# Patient Record
Sex: Female | Born: 1935
Health system: Southern US, Community
[De-identification: ages and names within clinical notes are randomized; demographics above are authoritative.]

## PROBLEM LIST (undated history)

## (undated) DIAGNOSIS — J449 Chronic obstructive pulmonary disease, unspecified: Secondary | ICD-10-CM

## (undated) DIAGNOSIS — I10 Essential (primary) hypertension: Secondary | ICD-10-CM

## (undated) DIAGNOSIS — I714 Abdominal aortic aneurysm, without rupture: Secondary | ICD-10-CM

## (undated) DIAGNOSIS — E785 Hyperlipidemia, unspecified: Secondary | ICD-10-CM

## (undated) HISTORY — DX: Hyperlipidemia, unspecified: E78.5

## (undated) HISTORY — DX: Abdominal aortic aneurysm, without rupture: I71.4

## (undated) HISTORY — PX: OTHER SURGICAL HISTORY: SHX169

## (undated) HISTORY — DX: Essential (primary) hypertension: I10

## (undated) HISTORY — DX: Chronic obstructive pulmonary disease, unspecified: J44.9

---

## 1966-11-18 HISTORY — PX: APPENDECTOMY: SHX54

## 2007-01-20 ENCOUNTER — Other Ambulatory Visit: Admission: RE | Admit: 2007-01-20 | Discharge: 2007-01-20 | Payer: Self-pay | Admitting: Family Medicine

## 2007-02-17 ENCOUNTER — Encounter: Admission: RE | Admit: 2007-02-17 | Discharge: 2007-02-17 | Payer: Self-pay | Admitting: Family Medicine

## 2008-02-02 ENCOUNTER — Encounter: Admission: RE | Admit: 2008-02-02 | Discharge: 2008-02-02 | Payer: Self-pay | Admitting: Family Medicine

## 2008-02-03 ENCOUNTER — Ambulatory Visit: Payer: Self-pay | Admitting: Vascular Surgery

## 2008-02-16 ENCOUNTER — Encounter: Admission: RE | Admit: 2008-02-16 | Discharge: 2008-02-16 | Payer: Self-pay | Admitting: Family Medicine

## 2008-03-01 ENCOUNTER — Encounter: Admission: RE | Admit: 2008-03-01 | Discharge: 2008-03-01 | Payer: Self-pay | Admitting: Family Medicine

## 2008-08-22 ENCOUNTER — Encounter: Admission: RE | Admit: 2008-08-22 | Discharge: 2008-08-22 | Payer: Self-pay | Admitting: Family Medicine

## 2008-09-06 ENCOUNTER — Encounter: Admission: RE | Admit: 2008-09-06 | Discharge: 2008-09-06 | Payer: Self-pay | Admitting: Family Medicine

## 2009-03-21 ENCOUNTER — Ambulatory Visit (HOSPITAL_COMMUNITY): Admission: RE | Admit: 2009-03-21 | Discharge: 2009-03-21 | Payer: Self-pay | Admitting: Family Medicine

## 2010-01-16 HISTORY — PX: COLONOSCOPY: SHX174

## 2010-04-11 ENCOUNTER — Ambulatory Visit (HOSPITAL_COMMUNITY): Admission: RE | Admit: 2010-04-11 | Discharge: 2010-04-11 | Payer: Self-pay | Admitting: Family Medicine

## 2010-12-09 ENCOUNTER — Encounter: Payer: Self-pay | Admitting: Family Medicine

## 2010-12-27 ENCOUNTER — Inpatient Hospital Stay (HOSPITAL_COMMUNITY): Payer: Medicare Other

## 2010-12-27 ENCOUNTER — Inpatient Hospital Stay (HOSPITAL_COMMUNITY)
Admission: AD | Admit: 2010-12-27 | Discharge: 2011-01-01 | DRG: 193 | Disposition: A | Discharge status: Home Health | Payer: Medicare Other | Source: Ambulatory Visit | Attending: Internal Medicine | Admitting: Internal Medicine

## 2010-12-27 DIAGNOSIS — R9431 Abnormal electrocardiogram [ECG] [EKG]: Secondary | ICD-10-CM | POA: Diagnosis present

## 2010-12-27 DIAGNOSIS — F17200 Nicotine dependence, unspecified, uncomplicated: Secondary | ICD-10-CM | POA: Diagnosis present

## 2010-12-27 DIAGNOSIS — R0902 Hypoxemia: Secondary | ICD-10-CM | POA: Diagnosis present

## 2010-12-27 DIAGNOSIS — R071 Chest pain on breathing: Secondary | ICD-10-CM | POA: Diagnosis present

## 2010-12-27 DIAGNOSIS — E785 Hyperlipidemia, unspecified: Secondary | ICD-10-CM | POA: Diagnosis present

## 2010-12-27 DIAGNOSIS — D649 Anemia, unspecified: Secondary | ICD-10-CM | POA: Diagnosis present

## 2010-12-27 DIAGNOSIS — G9340 Encephalopathy, unspecified: Secondary | ICD-10-CM | POA: Diagnosis not present

## 2010-12-27 DIAGNOSIS — J9600 Acute respiratory failure, unspecified whether with hypoxia or hypercapnia: Secondary | ICD-10-CM | POA: Diagnosis present

## 2010-12-27 DIAGNOSIS — J441 Chronic obstructive pulmonary disease with (acute) exacerbation: Secondary | ICD-10-CM | POA: Diagnosis present

## 2010-12-27 DIAGNOSIS — R946 Abnormal results of thyroid function studies: Secondary | ICD-10-CM | POA: Diagnosis present

## 2010-12-27 DIAGNOSIS — M1990 Unspecified osteoarthritis, unspecified site: Secondary | ICD-10-CM | POA: Diagnosis present

## 2010-12-27 DIAGNOSIS — D473 Essential (hemorrhagic) thrombocythemia: Secondary | ICD-10-CM | POA: Diagnosis present

## 2010-12-27 DIAGNOSIS — R7989 Other specified abnormal findings of blood chemistry: Secondary | ICD-10-CM | POA: Diagnosis present

## 2010-12-27 DIAGNOSIS — K625 Hemorrhage of anus and rectum: Secondary | ICD-10-CM | POA: Diagnosis not present

## 2010-12-27 DIAGNOSIS — J189 Pneumonia, unspecified organism: Principal | ICD-10-CM | POA: Diagnosis present

## 2010-12-27 DIAGNOSIS — I10 Essential (primary) hypertension: Secondary | ICD-10-CM | POA: Diagnosis present

## 2010-12-27 LAB — BASIC METABOLIC PANEL
CO2: 26 mEq/L (ref 19–32)
Chloride: 99 mEq/L (ref 96–112)
Creatinine, Ser: 1.03 mg/dL (ref 0.4–1.2)
Glucose, Bld: 108 mg/dL — ABNORMAL HIGH (ref 70–99)
Potassium: 4.3 mEq/L (ref 3.5–5.1)

## 2010-12-27 LAB — CBC
MCH: 30.4 pg (ref 26.0–34.0)
MCV: 86.7 fL (ref 78.0–100.0)
Platelets: 488 10*3/uL — ABNORMAL HIGH (ref 150–400)
RDW: 13.5 % (ref 11.5–15.5)

## 2010-12-28 ENCOUNTER — Inpatient Hospital Stay (HOSPITAL_COMMUNITY): Payer: Medicare Other

## 2010-12-28 LAB — HEMOGLOBIN A1C
Hgb A1c MFr Bld: 5.8 % — ABNORMAL HIGH (ref <5.7)
Mean Plasma Glucose: 120 mg/dL — ABNORMAL HIGH (ref <117)

## 2010-12-28 LAB — CBC
HCT: 31.7 % — ABNORMAL LOW (ref 36.0–46.0)
MCV: 86.1 fL (ref 78.0–100.0)
RBC: 3.68 MIL/uL — ABNORMAL LOW (ref 3.87–5.11)
RDW: 13.6 % (ref 11.5–15.5)

## 2010-12-28 LAB — TSH: TSH: 0.222 u[IU]/mL — ABNORMAL LOW (ref 0.350–4.500)

## 2010-12-28 LAB — COMPREHENSIVE METABOLIC PANEL
ALT: 38 U/L — ABNORMAL HIGH (ref 0–35)
AST: 42 U/L — ABNORMAL HIGH (ref 0–37)
Albumin: 2.2 g/dL — ABNORMAL LOW (ref 3.5–5.2)
BUN: 15 mg/dL (ref 6–23)
CO2: 22 mEq/L (ref 19–32)
Creatinine, Ser: 0.92 mg/dL (ref 0.4–1.2)
GFR calc Af Amer: >60 mL/min (ref >60)

## 2010-12-28 LAB — CARDIAC PANEL(CRET KIN+CKTOT+MB+TROPI): CK, MB: 5.3 ng/mL — ABNORMAL HIGH (ref 0.3–4.0)

## 2010-12-28 MED ORDER — IOHEXOL 300 MG/ML  SOLN: 100.0000 mL | Freq: Once | INTRAMUSCULAR | Status: AC | PRN

## 2010-12-28 MED ORDER — IOHEXOL 300 MG/ML  SOLN
100.0000 mL | Freq: Once | INTRAMUSCULAR | Status: AC | PRN
  Administered 2010-12-28: 100 mL via INTRAVENOUS

## 2010-12-29 LAB — BASIC METABOLIC PANEL
BUN: 11 mg/dL (ref 6–23)
Calcium: 7.6 mg/dL — ABNORMAL LOW (ref 8.4–10.5)
Chloride: 108 mEq/L (ref 96–112)
GFR calc non Af Amer: >60 mL/min (ref >60)
Glucose, Bld: 110 mg/dL — ABNORMAL HIGH (ref 70–99)
Potassium: 3.9 mEq/L (ref 3.5–5.1)

## 2010-12-29 LAB — CBC
MCH: 30 pg (ref 26.0–34.0)
RBC: 3.17 MIL/uL — ABNORMAL LOW (ref 3.87–5.11)

## 2010-12-30 LAB — DIFFERENTIAL
Basophils Relative: 0 % (ref 0–1)
Eosinophils Absolute: 0 10*3/uL (ref 0.0–0.7)
Eosinophils Relative: 0 % (ref 0–5)
Monocytes Absolute: 1.3 10*3/uL — ABNORMAL HIGH (ref 0.1–1.0)
Monocytes Relative: 6 % (ref 3–12)
Neutrophils Relative %: 85 % — ABNORMAL HIGH (ref 43–77)

## 2010-12-30 LAB — CULTURE, RESPIRATORY W GRAM STAIN: Culture: NORMAL

## 2010-12-30 LAB — COMPREHENSIVE METABOLIC PANEL
AST: 26 U/L (ref 0–37)
Albumin: 1.8 g/dL — ABNORMAL LOW (ref 3.5–5.2)
BUN: 14 mg/dL (ref 6–23)
Calcium: 8.1 mg/dL — ABNORMAL LOW (ref 8.4–10.5)
Creatinine, Ser: 0.73 mg/dL (ref 0.4–1.2)
GFR calc Af Amer: >60 mL/min (ref >60)

## 2010-12-30 LAB — T4, FREE: Free T4: 1.11 ng/dL (ref 0.80–1.80)

## 2010-12-30 LAB — CBC
MCH: 29.5 pg (ref 26.0–34.0)
MCHC: 34.1 g/dL (ref 30.0–36.0)
MCV: 86.3 fL (ref 78.0–100.0)
Platelets: 635 10*3/uL — ABNORMAL HIGH (ref 150–400)
RDW: 13.9 % (ref 11.5–15.5)

## 2010-12-30 LAB — T3, FREE: T3, Free: 1.4 pg/mL — ABNORMAL LOW (ref 2.3–4.2)

## 2010-12-31 LAB — CBC
HCT: 29.7 % — ABNORMAL LOW (ref 36.0–46.0)
Hemoglobin: 10 g/dL — ABNORMAL LOW (ref 12.0–15.0)
MCHC: 33.7 g/dL (ref 30.0–36.0)
RDW: 14 % (ref 11.5–15.5)
WBC: 13.9 10*3/uL — ABNORMAL HIGH (ref 4.0–10.5)

## 2011-01-02 NOTE — H&P (Signed)
Amanda Hinton, Amanda Hinton           ACCOUNT NO.:  [id number]  MEDICAL RECORD NO.:  [id number]           PATIENT TYPE:  I  LOCATION:  1436                         FACILITY:  [facility]  PHYSICIAN:  Thad Ranger, MD       DATE OF BIRTH:  1930/10/15  DATE OF ADMISSION:  12/27/2010 DATE OF DISCHARGE:                             HISTORY & PHYSICAL   PRIMARY CARE PHYSICIAN:  Caryn Bee L. Little, M.D.  CHIEF COMPLAINT:  Productive cough with fevers for the last 3-4 days.  HISTORY OF PRESENT ILLNESS:  Amanda Hinton is a 77 year old female with a history of active tobacco use, hypertension, hyperlipidemia, osteoporosis, history of abdominal aortic aneurysm, was sent from Dr. Caryn Bee Little's office today as a direct admit for admission.  History was provided by the patient who stated that she has been having productive cough for the last week and a half with green to grayish phlegm.  She also had fevers of 100 over the last 3 days.  She denies any prior history of pneumonia.  She states that she has been having some right-sided chest pain on deep breathing and coughing.  She specifically says that the right-sided chest pain hurts the most when she coughs "a lot."  She smokes one pack per day for last 50 years. Patient is not on any inhalers or has a diagnosis of prior COPD.  She denies any nausea, vomiting, abdominal pain, any diarrhea, constipation, hematochezia or melena.  She denies any palpitations or diaphoresis. She did have shortness of breath with hypoxia in Dr. Fredirick Maudlin office and patient was noted to have rales with expiratory wheezing and after the nebulizer treatments, pulse oximeter showed 86% on room air, hence patient was sent to the [facility] for admission.  PAST MEDICAL HISTORY: 1. Active tobacco use. 2. Hypertension. 3. Hyperlipidemia. 4. Osteoporosis. 5. History of abdominal aortic aneurysm.  SOCIAL HISTORY:  Patient smokes one pack per day for the last 50  years. Denies any alcohol or any drug use.  She currently lives at home, alone and is functional with her ADLs.  MEDICATIONS:  Medications prior to admission: 1. Multivitamin one tablet p.o. daily. 2. Aspirin 81 mg daily. 3. Colace 100 mg p.o. as needed daily. 4. Simvastatin 20 mg p.o. q.h.s. 5. Restasis 0.05% emulsion into effected eye twice a day. 6. Enalapril 5 mg p.o. daily. 7. Valacyclovir 500 mg p.o. daily. 8. Mucinex extended release q.12h. 9. Z-PAK started on Monday, 3 days ago. 10.Tussionex every 12 hours. 11.MiraLAX scoop every other day.  PHYSICAL EXAMINATION:  VITAL SIGNS:  Temperature 97.5, blood pressure 127/68, pulse 106. GENERAL:  Patient is alert, awake and oriented x3, not in acute distress. HEENT:  Atraumatic, normocephalic.  Pupils are reactive to light and accommodation.  EOMI. NECK:  Supple.  No lymphopathy.  No JVD. CVS:  Tachycardia, S1, S2. CHEST:  Expiratory wheezing bilaterally, right worse than left, noticed on coughing. ABDOMEN:  Soft, nontender, nondistended, normal bowel sounds. EXTREMITIES:  No cyanosis, clubbing or edema noted in upper or lower extremities bilaterally. NEUROLOGIC:  No focal neurological deficits noted.  LABORATORY AND DIAGNOSTIC DATA:  Labs are being pending  and has been ordered.  Chest x-ray from Dr. Fredirick Maudlin office shows right-sided infiltrate.  IMPRESSION AND PLAN:  Amanda Hinton is a 77 year old female, who was sent from primary care physician's office for hypoxia with right-sided pneumonia and chronic obstructive pulmonary disease exacerbation. 1. Hypoxia with chronic obstructive pulmonary disease exacerbation:     Patient will be admitted to the tele monitor floor given the     hypoxia.  Patient denies having chronic obstructive pulmonary     disease; however has active tobacco use over more than 50 years.     Patient will be placed on Xopenex nebulizer treatments as she is     quite tachycardic and also start on  Spiriva, prednisone with taper     and intravenous Avelox.  Patient should receive the pulmonary     function testing as well as home oxygen evaluation prior to the     discharge. 2. Right-sided pneumonia:  No signs or symptoms or any history of     aspiration.  Patient will be placed on intravenous Avelox, obtain     blood cultures and sputum cultures. 3. Hypertension:  Continue enalapril. 4. Hyperlipidemia:  I will obtain fasting lipid panel and continue     simvastatin. 5. History of anxiety:  Currently stable.  Continue trazodone p.r.n.     for insomnia. 6. Prophylaxis:  Place on Lovenox for deep venous thrombosis     prophylaxis.  Patient will be followed by [facility].     Thad Ranger, MD     RR/MEDQ  D:  12/27/2010  T:  12/27/2010  Job:  [id number]  cc:   Caryn Bee L. Little, M.D. Fax: [phone]  Electronically Signed by Akio Hudnall  on 01/02/2011 03:11:21 PM

## 2011-01-03 LAB — CULTURE, BLOOD (ROUTINE X 2): Culture  Setup Time: 201202100218

## 2011-01-08 ENCOUNTER — Other Ambulatory Visit: Payer: Self-pay | Admitting: Family Medicine

## 2011-01-08 DIAGNOSIS — J189 Pneumonia, unspecified organism: Secondary | ICD-10-CM

## 2011-01-09 ENCOUNTER — Encounter: Payer: Self-pay | Admitting: Internal Medicine

## 2011-01-09 ENCOUNTER — Ambulatory Visit
Admission: RE | Admit: 2011-01-09 | Discharge: 2011-01-09 | Disposition: A | Discharge status: Home / Self Care | Payer: Medicare Other | Source: Ambulatory Visit | Attending: Family Medicine | Admitting: Family Medicine

## 2011-01-09 DIAGNOSIS — J189 Pneumonia, unspecified organism: Secondary | ICD-10-CM

## 2011-01-09 MED ORDER — IOHEXOL 300 MG/ML  SOLN
75.0000 mL | Freq: Once | INTRAMUSCULAR | Status: AC | PRN
  Administered 2011-01-09: 75 mL via INTRAVENOUS

## 2011-01-13 NOTE — Discharge Summary (Signed)
Amanda Hinton, Amanda Hinton           ACCOUNT NO.:  [id number]  MEDICAL RECORD NO.:  [id number]           PATIENT TYPE:  I  LOCATION:  1436                         [facility]ACILITY:  [facility]  PHYSICIAN:  Marcellus Scott, MD     DATE OF BIRTH:  02-09-1930  DATE OF ADMISSION:  12/27/2010 DATE OF DISCHARGE:                              DISCHARGE SUMMARY   PRIMARY CARE PHYSICIAN:  Anna Genre. Little, M.D.  DISCHARGE DIAGNOSES: 1. Right multifocal community-acquired pneumonia. 2. Chronic obstructive pulmonary disease. 3. Acute hypoxic respiratory failure secondary to pneumonia and     chronic obstructive pulmonary disease, resolved. 4. Encephalopathy, resolved. 5. Anemia, stable. 6. Thrombocytosis, possibly reactive. 7. Prolonged QTC, improved. 8. Abnormal liver function tests, improved. 9. Abnormal thyroid function tests.  Repeat in 4-6 weeks. 10.An episode of rectal bleeding, resolved. 11.Hypertension. 12.Tobacco abuse.  DISCHARGE MEDICATIONS: 1. Tylenol 650 mg p.o. q. four hourly p.r.n. for pain or fever. 2. Albuterol HFA 2 puffs inhaled q. four hourly p.r.n. for dyspnea or     wheezing. 3. Ceftin 500 mg p.o. b.i.d. 4. Doxycycline 100 mg p.o. b.i.d. 5. Mucinex 600 mg p.o. b.i.d. 6. Robitussin DM 5 mL p.o. q. four hourly p.r.n. for cough. 7. Nicotine 21 mg per 24-hour patch transdermally daily. 8. Spiriva 18 mcg inhaled daily. 9. Prolia injection, one injection subcutaneously twice a year and the     next one is supposed to be due in March of this year. 10.Aspirin 81 mg p.o. daily. 11.Colace 100 mg p.o. daily. 12.Enalapril 5 mg p.o. daily. 13.MiraLax half cap p.o. at bedtime. 14.Multivitamins one p.o. daily. 15.Restasis 1 drop in left eye daily. 16.Simvastatin 20 mg p.o. q.h.s. 17.Valacyclovir 500 mg p.o. daily.  Duration to be determined by her     primary care MD.  DISCONTINUED MEDICATIONS: 1. Azithromycin 2. Extended release Tussionex.  PROCEDURES AND IMAGING STUDIES: 1. Chest  x-ray on February 10.  IMPRESSION:  Multifocal consolidation     in the right lung with small associated pleural effusion.     Hyperexpansion. 2. CT angiogram of the chest with contrast on February 10.     IMPRESSION:  A.  No evidence of acute pulmonary embolism.  B.     Multifocal right-sided pneumonia with a small associated pleural     effusion.  Moderate underlying emphysema is noted.  Reactive     adenopathy is noted in the mediastinum.  A followup chest x-ray was     recommended in 6-8 weeks to ensure radiographic resolution.  C.     Atherosclerosis.  PERTINENT LABS:  CBC:  Hemoglobin 10, hematocrit 30, white blood cell 14, and platelet count 616,000.  Free T3 1.43, T4 1.11, TSH 0.873. Sputum culture showed normal oropharyngeal flora.  Comprehensive metabolic panel on February 12 only remarkable for total protein 5.3 and albumin of 1.8, otherwise within normal limits.  Blood cultures x2 from February 9 are no growth to date.  Hemoglobin A1c of 5.8 and the initial TSH was 0.222.  Cardiac enzymes were cycled and not suggestive of acute coronary syndrome.  Lipid panel shows HDL of 26.  D-dimer was positive at 3.14.  Initial  white blood cell count of 28.4000.  CONSULTATIONS:  None.  DIET:  Heart healthy diet.  ACTIVITIES:  Increase activity slowly.  The patient will receive home health Physical Therapy.  TODAY'S COMPLAINTS:  The patient indicates that she has mild nonproductive cough but denies dyspnea or chest pain.  She ambulated in the halls without oxygen with O2 saturation at rest of 91% and after activity without oxygen was also 91%.  PHYSICAL EXAMINATION:  VITAL SIGNS: Temperature is a 98.9 degrees Fahrenheit, pulse 86 per minute, respiration 16 per minute, blood pressure 112/70 mmHg, saturating at 91-93% on room air. RESPIRATORY SYSTEM:  With improved breath sounds in the right base. Rest of the lung fields are clear.  No increased work of breathing. CARDIOVASCULAR  SYSTEM:  First and second heart sounds heard.  Regular rate and rhythm.  No JVD. ABDOMEN:  Soft and bowel sounds present. CENTRAL NERVOUS SYSTEM:  Patient is awake, alert, oriented x3 with no focal neurological deficits. EXTREMITIES:  With trace bilateral ankle edema but otherwise grade 5/5 power.  HOSPITAL COURSE:  Amanda Hinton is a 77 year old female patient with history of ongoing tobacco abuse, hypertension, hyperlipidemia, osteoporosis, abdominal aortic aneurysm, who was sent from the primary care physician's office as a direct admit her to the hospital.  She had been having productive cough for approximately a week and half and low- grade fevers.  She also had some right-sided pleuritic type of chest pain.  She had 50-pack-year smoking history.  There was no formal diagnosis of COPD.  In the primary care physician's office, she was noted to have expiratory wheezing and a pulse ox showing 86% on room air, and hence the patient was sent to the Emergency Room for admission where initial chest x-ray was suggestive of right-sided multifocal pneumonia. 1. Right-sided multifocal pneumonia.  The patient was admitted to the hospital.  She was empirically started on Avelox.  However, since     she had a QTC in excess of 490 milliseconds, she was switched to IV     Rocephin and p.o. doxycycline.  Today, she is day five of that     therapy.  Will complete a total 7 days of treatment.  She has     clinically done well with resolution of her dyspnea and chest pain.     She has minimal but decreased cough.  She is oxygenating well. 2. Acute hypoxic respiratory failure secondary to COPD complicated by     multifocal right-sided pneumonia.  With the treatment off the COPD     and pneumonia, we were able to taper her oxygen off and currently     she is maintaining oxygen saturations in the low 90s without oxygen     with and without exertion.  The patient had an elevated D-dimer     which was  followed up with a CT scan and pulmonary embolism was     ruled out. 3. COPD.  The patient briefly was on prednisone for presumed mild     exacerbation of COPD.  However that led to increasing confusion and     was promptly stopped.  She has no further bronchospasm.  She will     be started on Spiriva and albuterol inhalers p.r.n.  When she sees     her primary care physician, may consider pulmonary referral to do      formal lung function tests and may consider adding Advair or     Symbicort. 4. Encephalopathy, which was possibly  from her acute illness of     pneumonia, hypoxia, and may be an element of underlying dementia. 5. Hypertension which is reasonably controlled. 6. Tobacco abuse.  Cessation counseling done, and the patient will     have a nicotine patch. 7. Anemia which is stable. 8. Thrombocytosis which is probably reactive and may be followed up as     an outpatient by periodic CBC. 9. The patient had an episode of small amount of bright red blood on     one BM but subsequent 2 BMs had no bleeding and this was suspicious     for hemorrhoidal bleeding but she has not had any further. 10.Abnormal thyroid function tests.  Unclear etiology, but may repeat     in 4-6 weeks' time to evaluate. 11.Long QTC of 493 milliseconds.  This improved subsequently to 457     milliseconds. 12.Abnormal liver function tests also improved.  DISPOSITION:  The patient will be discharged home in stable condition.  FOLLOWUP AND RECOMMENDATION: 1. Chest x-ray in 4-6 weeks to ensure resolution of the CT scan     findings. 2. repeat thyroid function tests in 4-6 weeks. 3. Consider outpatient pulmonary MD consultation for formal lung     function tests. 4. Repeat CBC in a week or two.  Time taken coordinating this discharge is 45 minutes.     Marcellus Scott, MD     AH/MEDQ  D:  01/01/2011  T:  01/01/2011  Job:  [id number]  cc:   Caryn Bee L. Little, M.D. Fax: [phone]  Electronically Signed  by Marcellus Scott MD on 01/13/2011 10:40:02 PM

## 2011-01-23 DIAGNOSIS — J449 Chronic obstructive pulmonary disease, unspecified: Secondary | ICD-10-CM | POA: Insufficient documentation

## 2011-01-23 DIAGNOSIS — I10 Essential (primary) hypertension: Secondary | ICD-10-CM | POA: Insufficient documentation

## 2011-01-23 DIAGNOSIS — E785 Hyperlipidemia, unspecified: Secondary | ICD-10-CM | POA: Insufficient documentation

## 2011-01-23 DIAGNOSIS — M810 Age-related osteoporosis without current pathological fracture: Secondary | ICD-10-CM | POA: Insufficient documentation

## 2011-01-24 ENCOUNTER — Institutional Professional Consult (permissible substitution) (INDEPENDENT_AMBULATORY_CARE_PROVIDER_SITE_OTHER): Payer: Medicare Other | Admitting: Internal Medicine

## 2011-01-24 ENCOUNTER — Ambulatory Visit (INDEPENDENT_AMBULATORY_CARE_PROVIDER_SITE_OTHER)
Admission: RE | Admit: 2011-01-24 | Discharge: 2011-01-24 | Disposition: A | Discharge status: Home / Self Care | Payer: Medicare Other | Source: Ambulatory Visit | Attending: Internal Medicine | Admitting: Internal Medicine

## 2011-01-24 ENCOUNTER — Encounter: Payer: Self-pay | Admitting: Internal Medicine

## 2011-01-24 ENCOUNTER — Other Ambulatory Visit: Payer: Self-pay | Admitting: Internal Medicine

## 2011-01-24 DIAGNOSIS — J189 Pneumonia, unspecified organism: Secondary | ICD-10-CM

## 2011-01-24 DIAGNOSIS — J449 Chronic obstructive pulmonary disease, unspecified: Secondary | ICD-10-CM

## 2011-01-28 ENCOUNTER — Telehealth (INDEPENDENT_AMBULATORY_CARE_PROVIDER_SITE_OTHER): Payer: Self-pay | Admitting: *Deleted

## 2011-01-29 NOTE — Assessment & Plan Note (Signed)
Summary: Pulmonary/ new pt copd/cap > improving very slowly ? ace effect   Visit Type:  Initial Consult Copy to:  Dr. Maud Deed Primary Provider/Referring Provider:  Dr. Donovan Kail  CC:  Dyspnea.  History of Present Illness: 37 yowf quit smoking 12/2010 with baseline could grocery store due to fatigue > sob  January 24, 2011  1st pulmonary office eval abupt onset cough late January 2012 > hosp at [facility]  Feb 9-14 with cough, fever, sob >  mucus white was white, better, fever gone, sob a problem but fatigue is still the major problem, appetite poor  Presently Pt denies any significant sore throat, dysphagia, itching, sneezing,  nasal congestion or excess secretions,  fever, chills, sweats, unintended wt loss, pleuritic or exertional cp, hempoptysis, change in activity tolerance  orthopnea pnd or leg swelling Pt also denies any obvious fluctuation in symptoms with weather or environmental change or other alleviating or aggravating factors.       Current Medications (verified): 1)  Valtrex 500 Mg Tabs (Valacyclovir Hcl) .Marland Kitchen.. 1 Once Daily 2)  Enalapril Maleate 5 Mg Tabs (Enalapril Maleate) .Marland Kitchen.. 1 Once Daily 3)  Simvastatin 20 Mg Tabs (Simvastatin) .Marland Kitchen.. 1 Once Daily 4)  Spiriva Handihaler 18 Mcg Caps (Tiotropium Bromide Monohydrate) .... Inhale Contents of 1 Capsule Daily 5)  Multivitamins  Tabs (Multiple Vitamin) .Marland Kitchen.. 1 Once Daily 6)  Aspirin 81 Mg Tbec (Aspirin) .Marland Kitchen.. 1 Once Daily 7)  Miralax  Powd (Polyethylene Glycol 3350) .... 1/2 Capsul With Water Daily 8)  Stool Softener 100 Mg Caps (Docusate Sodium) .Marland Kitchen.. 1 Once Daily 9)  Restasis 0.05 % Emul (Cyclosporine) .... As Directed As Needed  Allergies (verified): 1)  ! Prednisone  Past History:  Past Surgical History: Appendectomy 1968  Family History: Heart dz- Mother  Social History: Patient states former smoker. Quit 12/2010, smoked 1 ppd x 54 yrs. 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  The patient denies shortness of breath at rest, coughing up blood, chest pain, irregular heartbeats, acid heartburn, indigestion, loss of appetite, weight change, abdominal pain, difficulty swallowing, sore throat, tooth/dental problems, headaches, nasal congestion/difficulty breathing through nose, sneezing, itching, ear ache, anxiety, depression, hand/feet swelling, joint stiffness or pain, rash, change in color of mucus, and fever.    Vital Signs:  Patient profile:   77 year old female Height:      65 inches Weight:      116.50 pounds BMI:     19.46 O2 Sat:      98 % on Room air Temp:     98.3 degrees F oral Pulse rate:   111 / minute BP sitting:   98 / 58  (left arm)  Vitals Entered By: Vernie Murders (January 24, 2011 11:12 AM)  O2 Flow:  Room air  Physical Exam  Additional Exam:  amb wf nad wt 116 January 24, 2011 Classic voice fatigue with pseudowheeze resolves with purse lip maneuver  HEENT mild turbinate edema.  Oropharynx no thrush or excess pnd or cobblestoning.  No JVD or cervical adenopathy. Mild accessory muscle hypertrophy. Trachea midline, nl thryroid. Chest was hyperinflated by percussion with diminished breath sounds and moderate increased exp time without wheeze. Hoover sign positive at mid inspiration. Regular rate and rhythm without murmur gallop or rub or increase P2 or edema.  Abd: no hsm, nl excursion. Ext warm  without cyanosis or clubbing.     CXR  Procedure date:  01/24/2011  Findings:      1.  Mild improvement in the right mid and lower lung airspace disease, consistent with resolving pneumonia. 2.  COPD and bibasilar scarring.  Impression & Recommendations:  Problem # 1:  PNEUMONIA ORGANISM NOS (ICD-486)  Slow to resolve c/w natural hx of CAP in copd  Problem # 2:  C O P D (ICD-496)   DDX of  difficult airways managment all start with A and  include Adherence, Ace  Inhibitors, Acid Reflux, Active Sinus Disease, Alpha 1 Antitripsin deficiency, Anxiety masquerading as Airways dz,  ABPA,  allergy(esp in young), Aspiration (esp in elderly), Adverse effects of DPI,  Active smokers, plus two Bs  = Bronchiectasis and Beta blocker use..and one C= CHF    Ace inhibitors problematic in terms of symptoom control. see #3     Problem # 3:  HYPERTENSION (ICD-401.9)  The following medications were removed from the medication list:    Enalapril Maleate 5 Mg Tabs (Enalapril maleate) .Marland Kitchen... 1 once daily  BP low and poor by mouth intake so try off ace  Comment:   ACE inhibitors are problematic in  pts with airway complaints because  even experienced pulmonologists can't always distinguish ace effects from copd/asthma.  By themselves they don't actually cause a problem, much like oxygen can't by itself start a fire, but they certainly serve as a powerful catalyst or enhancer for any "fire"  or inflammatory process in the upper airway, be it caused by an ET  tube or more commonly reflux (especially in the obese or pts with known GERD or who are on biphoshonates).  In the era of ARB near equivalency until we have a better handle on the reversibility of the airway problem, it just makes sense to avoid ace entirely in the short run and then decide later, having established a level of airway control using a reasonable limited regimen, whether to add back ace but even then being very careful to observe the pt for worsening airway control and number of meds used/ needed to control symptoms.    Medications Added to Medication List This Visit: 1)  Valtrex 500 Mg Tabs (Valacyclovir hcl) .Marland Kitchen.. 1 once daily 2)  Enalapril Maleate 5 Mg Tabs (Enalapril maleate) .Marland Kitchen.. 1 once daily 3)  Simvastatin 20 Mg Tabs (Simvastatin) .Marland Kitchen.. 1 once daily 4)  Spiriva Handihaler 18 Mcg Caps (Tiotropium bromide monohydrate) .... Inhale contents of 1 capsule daily 5)  Multivitamins Tabs (Multiple vitamin) .Marland Kitchen.. 1  once daily 6)  Aspirin 81 Mg Tbec (Aspirin) .Marland Kitchen.. 1 once daily 7)  Miralax Powd (Polyethylene glycol 3350) .... 1/2 capsul with water daily 8)  Stool Softener 100 Mg Caps (Docusate sodium) .Marland Kitchen.. 1 once daily 9)  Restasis 0.05 % Emul (Cyclosporine) .... As directed as needed  Other Orders: T-2 View CXR (71020TC) New Patient Level V (41324)  Patient Instructions: 1)  The most important aspect of your care is maintain off cigarettes 2)  Stop enapril for now 3)  Please schedule a follow-up appointment in 2 weeks, sooner if needed

## 2011-02-05 NOTE — Progress Notes (Signed)
Summary: would like a call regarding her mothers pneumonia  Phone Note Call from Patient   Caller: DAUGHTER/TERRISA Spooner Call For: [facility] Summary of Call: Patients daughter phoned stated that he mother Amanda Hinton was called with her xray results and that it still shows that she has pneumonia, and she has had it for 5 weeks and she wants to know if she needs to start a new antibiotic she took the last one this morning. Alexiana can be reached at [phone]. She stated that in the future we need to call her and not her mother. She states that she has her mothers power of attrny. Initial call taken by: Vedia Coffer,  January 28, 2011 1:25 PM  Follow-up for Phone Call        Spoke with pt's daughter Rosey Bath and notified that the cxr showed that her PNA is resolving and she does not need further abx at this time. She verbalized understanding. Follow-up by: Vernie Murders,  January 28, 2011 2:18 PM

## 2011-02-11 ENCOUNTER — Encounter: Payer: Self-pay | Admitting: Internal Medicine

## 2011-02-15 ENCOUNTER — Encounter: Payer: Self-pay | Admitting: Internal Medicine

## 2011-02-15 ENCOUNTER — Ambulatory Visit (INDEPENDENT_AMBULATORY_CARE_PROVIDER_SITE_OTHER): Payer: Medicare Other | Admitting: Internal Medicine

## 2011-02-15 ENCOUNTER — Ambulatory Visit (INDEPENDENT_AMBULATORY_CARE_PROVIDER_SITE_OTHER)
Admission: RE | Admit: 2011-02-15 | Discharge: 2011-02-15 | Disposition: A | Discharge status: Home / Self Care | Payer: Medicare Other | Source: Ambulatory Visit | Attending: Internal Medicine | Admitting: Internal Medicine

## 2011-02-15 VITALS — BP 124/58 | HR 93 | Temp 97.5°F | Ht 66.0 in | Wt 121.4 lb

## 2011-02-15 DIAGNOSIS — J189 Pneumonia, unspecified organism: Secondary | ICD-10-CM

## 2011-02-15 DIAGNOSIS — J449 Chronic obstructive pulmonary disease, unspecified: Secondary | ICD-10-CM

## 2011-02-15 MED ORDER — TIOTROPIUM BROMIDE MONOHYDRATE 18 MCG IN CAPS: 18.0000 ug | ORAL_CAPSULE | Freq: Every day | RESPIRATORY_TRACT | Status: DC

## 2011-02-15 NOTE — Assessment & Plan Note (Signed)
DDX of  difficult airways managment all start with A and  include Adherence, Ace Inhibitors, Acid Reflux, Active Sinus Disease, Alpha 1 Antitripsin deficiency, Anxiety masquerading as Airways dz,  ABPA,  allergy(esp in young), Aspiration (esp in elderly), Adverse effects of DPI,  Active smokers, plus two Bs  = Bronchiectasis and Beta blocker use..and one C= CHF  Much better off ace  Adherence is always the initial "prime suspect" and is a multilayered concern that requires a "trust but verify" approach in every patient - starting with knowing how to use medications, especially inhalers, correctly, keeping up with refills and understanding the fundamental difference between maintenance and prns vs those medications only taken for a very short course and then stopped and not refilled.   The proper method of use, as well as anticipated side effects, of this dry powder  inhaler are discussed and demonstrated to the patient.

## 2011-02-15 NOTE — Progress Notes (Signed)
  Subjective:    Patient ID: Amanda Hinton, female    DOB: [birth year], 77 y.o.   MRN: [id number]  HPI 27 yowf quit smoking 12/2010 with baseline could grocery store due to fatigue > sob   January 24, 2011 1st pulmonary office eval abupt onset cough late January 2012 > hosp at [facility] Feb 9-14 with cough, fever, sob > mucus white was white, better, fever gone, sob a problem but fatigue is still the major problem, appetite poor.  Imp copd/ ? Ace effect rec Patient Instructions:  1) The most important aspect of your care is maintain off cigarettes  2) Stop enapril for now  02/15/2011  Ov cough much better,  gradually better sob about the best she's been in months, not really limited if paces herself. Pt denies any significant sore throat, dysphagia, itching, sneezing,  nasal congestion or excess/ purulent secretions,  fever, chills, sweats, unintended wt loss, pleuritic or exertional cp, hempoptysis, orthopnea pnd or leg swelling.    Also denies any obvious fluctuation of symptoms with weather or environmental changes or other aggravating or alleviating factors.     PMHx COPD >  PFT's ordered 02/15/2011       - DPI 90% p coaching Hyperlipidemia Osteoporosis  Past Surgical History:  Appendectomy 1968  Family History:  Heart dz- Mother   Social History:  Patient states former smoker. Quit 12/2010, smoked 1 ppd x 54 yrs.  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 Oropharynx no thrush or excess pnd or cobblestoning. No JVD or cervical adenopathy. Mild accessory muscle hypertrophy. Trachea midline, nl thryroid. Chest was hyperinflated by percussion with diminished breath sounds and moderate increased exp time without wheeze. Hoover sign positive at mid inspiration. Regular rate and rhythm without murmur gallop or rub or increase P2 or edema. Abd: no hsm,  nl excursion. Ext warm without cyanosis or clubbi       Assessment & Plan:

## 2011-02-15 NOTE — Patient Instructions (Signed)
Stay as active as possible and avoid cigarettes at all costs!  Please schedule a follow up office visit in 6 weeks, call sooner if needed with pft's on return

## 2011-02-15 NOTE — Assessment & Plan Note (Signed)
Marked improvement in exam and cxr so needs one final study when returns

## 2011-02-18 NOTE — Progress Notes (Signed)
Quick Note:  Spoke with pt and notified of results per Dr. Wert. Pt verbalized understanding.   ______ 

## 2011-03-04 ENCOUNTER — Other Ambulatory Visit (HOSPITAL_COMMUNITY): Payer: Self-pay | Admitting: Obstetrics and Gynecology

## 2011-03-04 DIAGNOSIS — Z1231 Encounter for screening mammogram for malignant neoplasm of breast: Secondary | ICD-10-CM

## 2011-03-29 ENCOUNTER — Encounter: Payer: Self-pay | Admitting: Internal Medicine

## 2011-03-29 ENCOUNTER — Ambulatory Visit (INDEPENDENT_AMBULATORY_CARE_PROVIDER_SITE_OTHER): Payer: Medicare Other | Admitting: Internal Medicine

## 2011-03-29 ENCOUNTER — Ambulatory Visit (INDEPENDENT_AMBULATORY_CARE_PROVIDER_SITE_OTHER)
Admission: RE | Admit: 2011-03-29 | Discharge: 2011-03-29 | Disposition: A | Discharge status: Home / Self Care | Payer: Medicare Other | Source: Ambulatory Visit | Attending: Internal Medicine | Admitting: Internal Medicine

## 2011-03-29 DIAGNOSIS — J4489 Other specified chronic obstructive pulmonary disease: Secondary | ICD-10-CM

## 2011-03-29 DIAGNOSIS — J449 Chronic obstructive pulmonary disease, unspecified: Secondary | ICD-10-CM

## 2011-03-29 DIAGNOSIS — J189 Pneumonia, unspecified organism: Secondary | ICD-10-CM

## 2011-03-29 DIAGNOSIS — I10 Essential (primary) hypertension: Secondary | ICD-10-CM

## 2011-03-29 LAB — PULMONARY FUNCTION TEST

## 2011-03-29 NOTE — Assessment & Plan Note (Addendum)
She only has GOLD II COPD with minimal symptoms  As I explained to this patient in detail:  although there may be significant copd present, it does not appear to be limiting activity tolerance any more than a set of worn tires limits someone from driving a car  around a parking lot.  A new set of Michelins might look good but would have no perceived impact on the performance of the car and would not be worth the cost.   It is even conceivable that having stopped smoking at this level of copd that she would do just as well without the spiriva since it does not change long term outcomes in copd - smoking cessation does.  In addition I reviewed the Flethcher curve with patient that basically indicates  if you quit smoking when your best day FEV1 is still well preserved it is highly unlikely you will progress to severe disease and informed the patient there was no medication on the market that has proven to change the curve or the likelihood of progression.  Therefore stopping smoking and maintaining abstinence is the most important aspect of care, not choice of inhalers or for that matter, doctors.

## 2011-03-29 NOTE — Patient Instructions (Addendum)
Congratulation on stop smoking, it's the most important aspect of your care  We call you with your cxr report and send it to Dr Tenny Craw as well   If you are satisfied with your treatment plan let your doctor know and he/she can either refill your medications or you can return here when your prescription runs out.     If in any way you are not 100% satisfied,  please tell us.  If 100% better, tell your friends!

## 2011-03-29 NOTE — Progress Notes (Signed)
PFT done today. 

## 2011-03-29 NOTE — Progress Notes (Signed)
  Subjective:    Patient ID: AUSTRALIA DROLL, female    DOB: [birth year], 77 y.o.   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  Imp copd/ ? Ace effect rec   1) The most important aspect of your care is maintain off cigarettes  2) Stop enapril for now  02/15/2011  Ov cough much better,  gradually better sob about the best she's been in months, not really limited if paces herself.  rec f/u final cxr, maintain off cigs.  03/29/11 ov/Final summary f/u/ Calixto Pavel :  Cc doe back to basline, no cough, not really limited from any desired activities.   Sleeping ok without nocturnal  or early am exac of resp c/o's or need for noct saba or daytime saba for that matter.   Pt denies any significant sore throat, dysphagia, itching, sneezing,  nasal congestion or excess/ purulent secretions,  fever, chills, sweats, unintended wt loss, pleuritic or exertional cp, hempoptysis, orthopnea pnd or leg swelling.    Also denies any obvious fluctuation of symptoms with weather or environmental changes or other aggravating or alleviating factors.     PMHx COPD  GOLD II by PFT's 03/29/11       - DPI 90% p coaching Hyperlipidemia Osteoporosis  Past Surgical History:  Appendectomy 1968  Family History:  Heart dz- Mother   Social History:  Patient states former smoker. Quit 12/2010, smoked 1 ppd x 54 yrs.  Retired Char[employer] fundraiser  Widowed  Lives alone   Review of Systems     Objective:   Physical Exam wt 116 January 24, 2011  > 121  02/15/2011  > 130 03/29/11 HEENT mild turbinate edema. Oropharynx no thrush or excess pnd or cobblestoning. No JVD or cervical adenopathy. Mild accessory muscle hypertrophy. Trachea midline,  nl thryroid. Chest was hyperinflated by percussion with diminished breath sounds and moderate increased exp time without wheeze. Hoover sign positive at mid inspiration. Regular rate and rhythm without murmur gallop or rub or increase P2 or edema. Abd: no hsm, nl excursion. Ext warm without cyanosis or clubbi     cxr 03/29/11 Resolution of opacity at the right lung base. No active infiltrate  or effusion. COPD.   Assessment & Plan:

## 2011-03-31 NOTE — Assessment & Plan Note (Signed)
ACEI stopped 01/24/2011 due to pseudoasthma > resolved and would not use this class again in the era of ARB equivalency

## 2011-04-01 NOTE — Progress Notes (Signed)
Quick Note:  Spoke with pt and notified of results per Dr. Wert. Pt verbalized understanding and denied any questions.  ______ 

## 2011-04-02 NOTE — Procedures (Signed)
CAROTID DUPLEX EXAM   INDICATION:  Right carotid bruit.   HISTORY:  Diabetes:  No.  Cardiac:  No.  Hypertension:  Yes.  Smoking:  Yes.  Previous Surgery:  None.  CV History:  Amaurosis Fugax No, Paresthesias No, Hemiparesis No                                       RIGHT             LEFT  Brachial systolic pressure:         130               130  Brachial Doppler waveforms:         Biphasic          Biphasic  Vertebral direction of flow:        Antegrade         Antegrade  DUPLEX VELOCITIES (cm/sec)  CCA peak systolic                   150               150  ECA peak systolic                   121               130  ICA peak systolic                   156               196  ICA end diastolic                   31                58  PLAQUE MORPHOLOGY:                  Heterogenous      Heterogenous  PLAQUE AMOUNT:                      Moderate          Moderate  PLAQUE LOCATION:                    ICA, ECA          ICA, ECA   IMPRESSION:  1. 40-59% stenosis noted in bilateral internal carotid arteries.  2. Antegrade bilateral vertebral arteries.       ___________________________________________  Larina Earthly, M.D.   MG/MEDQ  D:  02/03/2008  T:  02/03/2008  Job:  [id number]

## 2011-04-09 ENCOUNTER — Encounter: Payer: Self-pay | Admitting: Internal Medicine

## 2011-04-23 ENCOUNTER — Ambulatory Visit (HOSPITAL_COMMUNITY)
Admission: RE | Admit: 2011-04-23 | Discharge: 2011-04-23 | Disposition: A | Discharge status: Home / Self Care | Payer: Medicare Other | Source: Ambulatory Visit | Attending: Obstetrics and Gynecology | Admitting: Obstetrics and Gynecology

## 2011-04-23 DIAGNOSIS — Z1231 Encounter for screening mammogram for malignant neoplasm of breast: Secondary | ICD-10-CM

## 2012-03-18 ENCOUNTER — Other Ambulatory Visit (HOSPITAL_COMMUNITY): Payer: Self-pay | Admitting: Obstetrics and Gynecology

## 2012-03-18 DIAGNOSIS — Z1231 Encounter for screening mammogram for malignant neoplasm of breast: Secondary | ICD-10-CM

## 2012-04-02 DIAGNOSIS — B0059 Other herpesviral disease of eye: Secondary | ICD-10-CM | POA: Diagnosis not present

## 2012-04-02 DIAGNOSIS — M810 Age-related osteoporosis without current pathological fracture: Secondary | ICD-10-CM | POA: Diagnosis not present

## 2012-04-02 DIAGNOSIS — J449 Chronic obstructive pulmonary disease, unspecified: Secondary | ICD-10-CM | POA: Diagnosis not present

## 2012-04-02 DIAGNOSIS — E7800 Pure hypercholesterolemia, unspecified: Secondary | ICD-10-CM | POA: Diagnosis not present

## 2012-04-02 DIAGNOSIS — Z79899 Other long term (current) drug therapy: Secondary | ICD-10-CM | POA: Diagnosis not present

## 2012-05-04 DIAGNOSIS — M810 Age-related osteoporosis without current pathological fracture: Secondary | ICD-10-CM | POA: Diagnosis not present

## 2012-05-15 ENCOUNTER — Ambulatory Visit (HOSPITAL_COMMUNITY)
Admission: RE | Admit: 2012-05-15 | Discharge: 2012-05-15 | Disposition: A | Discharge status: Home / Self Care | Payer: Medicare Other | Source: Ambulatory Visit | Attending: Obstetrics and Gynecology | Admitting: Obstetrics and Gynecology

## 2012-05-15 DIAGNOSIS — Z1231 Encounter for screening mammogram for malignant neoplasm of breast: Secondary | ICD-10-CM | POA: Diagnosis not present

## 2012-05-15 DIAGNOSIS — I714 Abdominal aortic aneurysm, without rupture: Secondary | ICD-10-CM | POA: Diagnosis not present

## 2012-05-26 DIAGNOSIS — L608 Other nail disorders: Secondary | ICD-10-CM | POA: Diagnosis not present

## 2012-06-15 DIAGNOSIS — R21 Rash and other nonspecific skin eruption: Secondary | ICD-10-CM | POA: Diagnosis not present

## 2012-07-30 DIAGNOSIS — H179 Unspecified corneal scar and opacity: Secondary | ICD-10-CM | POA: Diagnosis not present

## 2012-07-30 DIAGNOSIS — H538 Other visual disturbances: Secondary | ICD-10-CM | POA: Diagnosis not present

## 2012-07-30 DIAGNOSIS — H04129 Dry eye syndrome of unspecified lacrimal gland: Secondary | ICD-10-CM | POA: Diagnosis not present

## 2012-10-06 DIAGNOSIS — M810 Age-related osteoporosis without current pathological fracture: Secondary | ICD-10-CM | POA: Diagnosis not present

## 2012-10-06 DIAGNOSIS — R21 Rash and other nonspecific skin eruption: Secondary | ICD-10-CM | POA: Diagnosis not present

## 2012-11-18 DIAGNOSIS — I714 Abdominal aortic aneurysm, without rupture, unspecified: Secondary | ICD-10-CM

## 2012-11-18 HISTORY — DX: Abdominal aortic aneurysm, without rupture, unspecified: I71.40

## 2012-11-18 HISTORY — DX: Abdominal aortic aneurysm, without rupture: I71.4

## 2012-11-27 DIAGNOSIS — I714 Abdominal aortic aneurysm, without rupture: Secondary | ICD-10-CM | POA: Diagnosis not present

## 2013-04-06 DIAGNOSIS — M810 Age-related osteoporosis without current pathological fracture: Secondary | ICD-10-CM | POA: Diagnosis not present

## 2013-04-06 DIAGNOSIS — E7800 Pure hypercholesterolemia, unspecified: Secondary | ICD-10-CM | POA: Diagnosis not present

## 2013-04-06 DIAGNOSIS — Z79899 Other long term (current) drug therapy: Secondary | ICD-10-CM | POA: Diagnosis not present

## 2013-04-06 DIAGNOSIS — R21 Rash and other nonspecific skin eruption: Secondary | ICD-10-CM | POA: Diagnosis not present

## 2013-05-18 ENCOUNTER — Other Ambulatory Visit (HOSPITAL_COMMUNITY): Payer: Self-pay | Admitting: Obstetrics and Gynecology

## 2013-05-18 DIAGNOSIS — Z1231 Encounter for screening mammogram for malignant neoplasm of breast: Secondary | ICD-10-CM

## 2013-05-26 ENCOUNTER — Ambulatory Visit (HOSPITAL_COMMUNITY)
Admission: RE | Admit: 2013-05-26 | Discharge: 2013-05-26 | Disposition: A | Discharge status: Home / Self Care | Payer: Medicare Other | Source: Ambulatory Visit | Attending: Obstetrics and Gynecology | Admitting: Obstetrics and Gynecology

## 2013-05-26 DIAGNOSIS — Z1231 Encounter for screening mammogram for malignant neoplasm of breast: Secondary | ICD-10-CM | POA: Diagnosis not present

## 2013-08-05 DIAGNOSIS — H538 Other visual disturbances: Secondary | ICD-10-CM | POA: Diagnosis not present

## 2013-08-05 DIAGNOSIS — H04129 Dry eye syndrome of unspecified lacrimal gland: Secondary | ICD-10-CM | POA: Diagnosis not present

## 2013-08-05 DIAGNOSIS — H179 Unspecified corneal scar and opacity: Secondary | ICD-10-CM | POA: Diagnosis not present

## 2013-10-19 DIAGNOSIS — R351 Nocturia: Secondary | ICD-10-CM | POA: Diagnosis not present

## 2013-10-19 DIAGNOSIS — F0390 Unspecified dementia without behavioral disturbance: Secondary | ICD-10-CM | POA: Diagnosis not present

## 2013-10-19 DIAGNOSIS — I714 Abdominal aortic aneurysm, without rupture: Secondary | ICD-10-CM | POA: Diagnosis not present

## 2013-10-19 DIAGNOSIS — J449 Chronic obstructive pulmonary disease, unspecified: Secondary | ICD-10-CM | POA: Diagnosis not present

## 2013-12-01 ENCOUNTER — Other Ambulatory Visit (HOSPITAL_COMMUNITY): Payer: Self-pay | Admitting: Cardiology

## 2013-12-01 DIAGNOSIS — I714 Abdominal aortic aneurysm, without rupture, unspecified: Secondary | ICD-10-CM

## 2013-12-03 ENCOUNTER — Ambulatory Visit (HOSPITAL_COMMUNITY): Payer: Medicare Other | Attending: Cardiovascular Disease

## 2013-12-03 ENCOUNTER — Encounter: Payer: Self-pay | Admitting: Cardiovascular Disease

## 2013-12-03 DIAGNOSIS — I10 Essential (primary) hypertension: Secondary | ICD-10-CM | POA: Insufficient documentation

## 2013-12-03 DIAGNOSIS — I700 Atherosclerosis of aorta: Secondary | ICD-10-CM | POA: Diagnosis not present

## 2013-12-03 DIAGNOSIS — J449 Chronic obstructive pulmonary disease, unspecified: Secondary | ICD-10-CM | POA: Diagnosis not present

## 2013-12-03 DIAGNOSIS — J4489 Other specified chronic obstructive pulmonary disease: Secondary | ICD-10-CM | POA: Insufficient documentation

## 2013-12-03 DIAGNOSIS — E785 Hyperlipidemia, unspecified: Secondary | ICD-10-CM | POA: Diagnosis not present

## 2013-12-03 DIAGNOSIS — I714 Abdominal aortic aneurysm, without rupture, unspecified: Secondary | ICD-10-CM | POA: Insufficient documentation

## 2013-12-03 DIAGNOSIS — Z87891 Personal history of nicotine dependence: Secondary | ICD-10-CM | POA: Diagnosis not present

## 2013-12-08 ENCOUNTER — Encounter (HOSPITAL_COMMUNITY): Payer: Self-pay | Admitting: Interventional Cardiology

## 2014-04-01 DIAGNOSIS — Z131 Encounter for screening for diabetes mellitus: Secondary | ICD-10-CM | POA: Diagnosis not present

## 2014-04-01 DIAGNOSIS — M810 Age-related osteoporosis without current pathological fracture: Secondary | ICD-10-CM | POA: Diagnosis not present

## 2014-04-01 DIAGNOSIS — E7800 Pure hypercholesterolemia, unspecified: Secondary | ICD-10-CM | POA: Diagnosis not present

## 2014-04-01 DIAGNOSIS — F411 Generalized anxiety disorder: Secondary | ICD-10-CM | POA: Diagnosis not present

## 2014-04-25 ENCOUNTER — Other Ambulatory Visit (HOSPITAL_COMMUNITY): Payer: Self-pay | Admitting: Family Medicine

## 2014-04-25 DIAGNOSIS — Z1231 Encounter for screening mammogram for malignant neoplasm of breast: Secondary | ICD-10-CM

## 2014-05-04 DIAGNOSIS — R413 Other amnesia: Secondary | ICD-10-CM | POA: Diagnosis not present

## 2014-05-04 DIAGNOSIS — Z23 Encounter for immunization: Secondary | ICD-10-CM | POA: Diagnosis not present

## 2014-05-04 DIAGNOSIS — J449 Chronic obstructive pulmonary disease, unspecified: Secondary | ICD-10-CM | POA: Diagnosis not present

## 2014-05-13 DIAGNOSIS — IMO0002 Reserved for concepts with insufficient information to code with codable children: Secondary | ICD-10-CM | POA: Diagnosis not present

## 2014-05-30 ENCOUNTER — Ambulatory Visit (HOSPITAL_COMMUNITY)
Admission: RE | Admit: 2014-05-30 | Discharge: 2014-05-30 | Disposition: A | Discharge status: Home / Self Care | Payer: Medicare Other | Source: Ambulatory Visit | Attending: Family Medicine | Admitting: Family Medicine

## 2014-05-30 DIAGNOSIS — Z1231 Encounter for screening mammogram for malignant neoplasm of breast: Secondary | ICD-10-CM | POA: Insufficient documentation

## 2014-08-18 ENCOUNTER — Encounter: Payer: Self-pay | Admitting: General Surgery

## 2014-08-24 DIAGNOSIS — H04129 Dry eye syndrome of unspecified lacrimal gland: Secondary | ICD-10-CM | POA: Diagnosis not present

## 2014-08-24 DIAGNOSIS — H179 Unspecified corneal scar and opacity: Secondary | ICD-10-CM | POA: Diagnosis not present

## 2014-08-24 DIAGNOSIS — H538 Other visual disturbances: Secondary | ICD-10-CM | POA: Diagnosis not present

## 2014-08-24 DIAGNOSIS — H43812 Vitreous degeneration, left eye: Secondary | ICD-10-CM | POA: Diagnosis not present

## 2014-10-26 DIAGNOSIS — R198 Other specified symptoms and signs involving the digestive system and abdomen: Secondary | ICD-10-CM | POA: Diagnosis not present

## 2014-10-26 DIAGNOSIS — J449 Chronic obstructive pulmonary disease, unspecified: Secondary | ICD-10-CM | POA: Diagnosis not present

## 2014-10-26 DIAGNOSIS — M810 Age-related osteoporosis without current pathological fracture: Secondary | ICD-10-CM | POA: Diagnosis not present

## 2014-10-26 DIAGNOSIS — B0059 Other herpesviral disease of eye: Secondary | ICD-10-CM | POA: Diagnosis not present

## 2014-10-26 DIAGNOSIS — I714 Abdominal aortic aneurysm, without rupture: Secondary | ICD-10-CM | POA: Diagnosis not present

## 2014-10-27 ENCOUNTER — Other Ambulatory Visit: Payer: Self-pay | Admitting: Family Medicine

## 2014-10-27 DIAGNOSIS — I714 Abdominal aortic aneurysm, without rupture, unspecified: Secondary | ICD-10-CM

## 2014-12-02 ENCOUNTER — Other Ambulatory Visit: Payer: Medicare Other

## 2015-01-05 ENCOUNTER — Telehealth (HOSPITAL_COMMUNITY): Payer: Self-pay | Admitting: Interventional Cardiology

## 2015-02-03 ENCOUNTER — Ambulatory Visit (INDEPENDENT_AMBULATORY_CARE_PROVIDER_SITE_OTHER): Payer: Medicare Other | Admitting: Internal Medicine

## 2015-02-03 ENCOUNTER — Encounter: Payer: Self-pay | Admitting: Internal Medicine

## 2015-02-03 ENCOUNTER — Encounter (INDEPENDENT_AMBULATORY_CARE_PROVIDER_SITE_OTHER): Payer: Self-pay

## 2015-02-03 VITALS — BP 140/80 | HR 79 | Ht 65.0 in | Wt 147.4 lb

## 2015-02-03 DIAGNOSIS — J449 Chronic obstructive pulmonary disease, unspecified: Secondary | ICD-10-CM | POA: Diagnosis not present

## 2015-02-03 MED ORDER — UMECLIDINIUM BROMIDE 62.5 MCG/INH IN AEPB: 1.0000 | INHALATION_SPRAY | Freq: Every morning | RESPIRATORY_TRACT | Status: DC

## 2015-02-03 NOTE — Progress Notes (Signed)
Subjective:    Patient ID: Amanda Hinton, female    DOB: Jun 16, 1930,  .   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  Imp copd/ ? Ace effect rec   1) The most important aspect of your care is maintain off cigarettes  2) Stop enapril for now  02/15/2011  Ov cough much better,  gradually better sob about the best she's been in months, not really limited if paces herself.  rec f/u final cxr, maintain off cigs.  03/29/11 ov/Final summary f/u/ Amanda Hinton :  Cc doe back to basline, no cough, not really limited from any desired activities.   Sleeping ok without nocturnal  or early am exac of resp c/o's or need for noct saba or daytime saba for that matter rec Congratulation on stop smoking, it's the most important aspect of your care   02/03/2015  Seen as new pt eval Amanda Hinton re: GOLD II copd  Chief Complaint  Patient presents with  . Follow-up    Last seen in 2012. Pt here b/c ins wants to switch her to spiriva respimat and she prefers to stay on handihaler. Breathing is doing well and she denies any co's today.    Never using saba any more since on lama/off acei >  able to walk around [employer]  s stopping due to sob / slow pace/ no 02    No obvious day to day or daytime variabilty or assoc chronic cough or cp or chest tightness, subjective wheeze overt sinus or hb symptoms. No unusual exp hx or h/o childhood pna/ asthma or knowledge of premature birth.  Sleeping ok without nocturnal  or early am exacerbation  of respiratory  c/o's or need for noct saba. Also denies any obvious fluctuation of symptoms with weather  or environmental changes or other aggravating or alleviating factors except as outlined above   Current Medications, Allergies, Complete Past Medical History, Past Surgical History, Family History, and Social History were reviewed in [employer] record.  ROS  The following are not active complaints unless bolded sore throat, dysphagia, dental problems, itching, sneezing,  nasal congestion or excess/ purulent secretions, ear ache,   fever, chills, sweats, unintended wt loss, pleuritic or exertional cp, hemoptysis,  orthopnea pnd or leg swelling, presyncope, palpitations, heartburn, abdominal pain, anorexia, nausea, vomiting, diarrhea  or change in bowel or urinary habits, change in stools or urine, dysuria,hematuria,  rash, arthralgias, visual complaints, headache, numbness weakness or ataxia or problems with walking or coordination,  change in mood/affect or memory.        PMHx COPD  GOLD II by PFT's 03/29/11       - DPI 90% p coaching Hyperlipidemia Osteoporosis  Past Surgical History:  Appendectomy 1968  Family History:  Heart dz- Mother   Social History:  Patient states former smoker. Quit 12/2010, smoked 1 ppd x 54 yrs.  Retired Therapist, sports  Widowed  Lives alone       Objective:   Physical Exam wt 116 January 24, 2011  > 121  02/15/2011  > 130  03/29/11> 02/03/15 147   amb wf nad   HEENT mild turbinate edema. Oropharynx no thrush or excess pnd or cobblestoning. No JVD or cervical adenopathy. Mild accessory muscle hypertrophy. Trachea midline, nl thryroid. Chest was hyperinflated by percussion with diminished breath sounds and moderate increased exp time without wheeze. Hoover sign positive at mid inspiration. Regular rate and rhythm without murmur gallop or rub or increase P2 or edema. Abd: no hsm, nl excursion. Ext warm without cyanosis or clubbing    No recent cxr on file      Assessment & Plan:

## 2015-02-03 NOTE — Patient Instructions (Signed)
Stop spiriva and if you notice a decline in your activity tolerance restart LAMA = Incruse one click each am/ 2 drags  Pulmonary follow up is as needed

## 2015-02-04 ENCOUNTER — Encounter: Payer: Self-pay | Admitting: Internal Medicine

## 2015-02-04 NOTE — Assessment & Plan Note (Signed)
-   PFT's  03/29/2011  FEV1 1.46 (74%)  57 ratio no response to B2 and DLC0 52%  - d/c spiriva since insurance won't cover the dpi and start incruse if notes delcine in ex tolerance   I had an extended discussion with the patient reviewing all relevant studies completed to date and  lasting 15 to 20 minutes of a 25 minute visit on the following ongoing concerns:    The key to longterm in outcomes in copd is tied to stopping smoking and preventing exacerbations and treating symptoms - she's already quit smoking and off acei no longer having "exacerbations".  In fact, off acei She's having really minimal symptoms on LAMA but only has moderate dz so if having trouble affording it thru insurance it's fine with me to try off and if notes decline in ex tol then add back incruse if covered, if not try tudorza as she cannot tolerate the respimat format of spiriva.  The proper method of use, as well as anticipated side effects, of a dry powder inhaler are discussed and demonstrated to the patient. Improved effectiveness after extensive coaching during this visit to a level of approximately  90%

## 2015-04-24 DIAGNOSIS — R413 Other amnesia: Secondary | ICD-10-CM | POA: Diagnosis not present

## 2015-04-27 DIAGNOSIS — B0059 Other herpesviral disease of eye: Secondary | ICD-10-CM | POA: Diagnosis not present

## 2015-04-27 DIAGNOSIS — F0390 Unspecified dementia without behavioral disturbance: Secondary | ICD-10-CM | POA: Diagnosis not present

## 2015-05-04 ENCOUNTER — Other Ambulatory Visit (HOSPITAL_COMMUNITY): Payer: Self-pay | Admitting: Family Medicine

## 2015-05-04 DIAGNOSIS — Z1231 Encounter for screening mammogram for malignant neoplasm of breast: Secondary | ICD-10-CM

## 2015-06-16 ENCOUNTER — Other Ambulatory Visit (HOSPITAL_COMMUNITY): Payer: Self-pay | Admitting: Family Medicine

## 2015-06-16 ENCOUNTER — Ambulatory Visit (HOSPITAL_COMMUNITY)
Admission: RE | Admit: 2015-06-16 | Discharge: 2015-06-16 | Disposition: A | Discharge status: Home / Self Care | Payer: Medicare Other | Source: Ambulatory Visit | Attending: Family Medicine | Admitting: Family Medicine

## 2015-06-16 DIAGNOSIS — Z1231 Encounter for screening mammogram for malignant neoplasm of breast: Secondary | ICD-10-CM

## 2015-08-28 DIAGNOSIS — F0390 Unspecified dementia without behavioral disturbance: Secondary | ICD-10-CM | POA: Diagnosis not present

## 2015-10-27 DIAGNOSIS — Z131 Encounter for screening for diabetes mellitus: Secondary | ICD-10-CM | POA: Diagnosis not present

## 2015-10-27 DIAGNOSIS — F0390 Unspecified dementia without behavioral disturbance: Secondary | ICD-10-CM | POA: Diagnosis not present

## 2015-10-27 DIAGNOSIS — R252 Cramp and spasm: Secondary | ICD-10-CM | POA: Diagnosis not present

## 2015-10-27 DIAGNOSIS — I714 Abdominal aortic aneurysm, without rupture: Secondary | ICD-10-CM | POA: Diagnosis not present

## 2015-10-27 DIAGNOSIS — E7800 Pure hypercholesterolemia, unspecified: Secondary | ICD-10-CM | POA: Diagnosis not present

## 2015-10-27 DIAGNOSIS — M810 Age-related osteoporosis without current pathological fracture: Secondary | ICD-10-CM | POA: Diagnosis not present

## 2015-11-07 DIAGNOSIS — R05 Cough: Secondary | ICD-10-CM | POA: Diagnosis not present

## 2016-05-17 DIAGNOSIS — M810 Age-related osteoporosis without current pathological fracture: Secondary | ICD-10-CM | POA: Diagnosis not present

## 2016-05-17 DIAGNOSIS — F0390 Unspecified dementia without behavioral disturbance: Secondary | ICD-10-CM | POA: Diagnosis not present

## 2016-05-17 DIAGNOSIS — B0059 Other herpesviral disease of eye: Secondary | ICD-10-CM | POA: Diagnosis not present

## 2016-05-17 DIAGNOSIS — E7800 Pure hypercholesterolemia, unspecified: Secondary | ICD-10-CM | POA: Diagnosis not present

## 2016-05-17 DIAGNOSIS — I714 Abdominal aortic aneurysm, without rupture: Secondary | ICD-10-CM | POA: Diagnosis not present

## 2016-05-17 DIAGNOSIS — R252 Cramp and spasm: Secondary | ICD-10-CM | POA: Diagnosis not present

## 2016-05-17 DIAGNOSIS — R32 Unspecified urinary incontinence: Secondary | ICD-10-CM | POA: Diagnosis not present

## 2016-05-17 DIAGNOSIS — Z0000 Encounter for general adult medical examination without abnormal findings: Secondary | ICD-10-CM | POA: Diagnosis not present

## 2016-05-17 DIAGNOSIS — J449 Chronic obstructive pulmonary disease, unspecified: Secondary | ICD-10-CM | POA: Diagnosis not present

## 2016-05-17 DIAGNOSIS — Z131 Encounter for screening for diabetes mellitus: Secondary | ICD-10-CM | POA: Diagnosis not present

## 2016-05-22 ENCOUNTER — Other Ambulatory Visit: Payer: Self-pay | Admitting: Family Medicine

## 2016-05-22 DIAGNOSIS — I714 Abdominal aortic aneurysm, without rupture, unspecified: Secondary | ICD-10-CM

## 2016-06-14 ENCOUNTER — Ambulatory Visit
Admission: RE | Admit: 2016-06-14 | Discharge: 2016-06-14 | Disposition: A | Discharge status: Home / Self Care | Payer: Medicare Other | Source: Ambulatory Visit | Attending: Family Medicine | Admitting: Family Medicine

## 2016-06-14 DIAGNOSIS — I714 Abdominal aortic aneurysm, without rupture, unspecified: Secondary | ICD-10-CM

## 2016-07-24 DIAGNOSIS — H179 Unspecified corneal scar and opacity: Secondary | ICD-10-CM | POA: Diagnosis not present

## 2016-07-24 DIAGNOSIS — Z961 Presence of intraocular lens: Secondary | ICD-10-CM | POA: Diagnosis not present

## 2016-07-24 DIAGNOSIS — H538 Other visual disturbances: Secondary | ICD-10-CM | POA: Diagnosis not present

## 2016-07-24 DIAGNOSIS — H04129 Dry eye syndrome of unspecified lacrimal gland: Secondary | ICD-10-CM | POA: Diagnosis not present

## 2016-11-27 DIAGNOSIS — J441 Chronic obstructive pulmonary disease with (acute) exacerbation: Secondary | ICD-10-CM | POA: Diagnosis not present

## 2016-12-19 DIAGNOSIS — F0390 Unspecified dementia without behavioral disturbance: Secondary | ICD-10-CM | POA: Diagnosis not present

## 2016-12-19 DIAGNOSIS — R05 Cough: Secondary | ICD-10-CM | POA: Diagnosis not present

## 2016-12-19 DIAGNOSIS — J449 Chronic obstructive pulmonary disease, unspecified: Secondary | ICD-10-CM | POA: Diagnosis not present

## 2017-01-29 DIAGNOSIS — H179 Unspecified corneal scar and opacity: Secondary | ICD-10-CM | POA: Diagnosis not present

## 2017-01-29 DIAGNOSIS — H538 Other visual disturbances: Secondary | ICD-10-CM | POA: Diagnosis not present

## 2017-01-29 DIAGNOSIS — Z961 Presence of intraocular lens: Secondary | ICD-10-CM | POA: Diagnosis not present

## 2017-01-29 DIAGNOSIS — H3562 Retinal hemorrhage, left eye: Secondary | ICD-10-CM | POA: Diagnosis not present

## 2017-01-30 DIAGNOSIS — H353211 Exudative age-related macular degeneration, right eye, with active choroidal neovascularization: Secondary | ICD-10-CM | POA: Diagnosis not present

## 2017-01-30 DIAGNOSIS — H353122 Nonexudative age-related macular degeneration, left eye, intermediate dry stage: Secondary | ICD-10-CM | POA: Diagnosis not present

## 2017-01-30 DIAGNOSIS — H35033 Hypertensive retinopathy, bilateral: Secondary | ICD-10-CM | POA: Diagnosis not present

## 2017-01-30 DIAGNOSIS — H348322 Tributary (branch) retinal vein occlusion, left eye, stable: Secondary | ICD-10-CM | POA: Diagnosis not present

## 2017-03-06 DIAGNOSIS — H353211 Exudative age-related macular degeneration, right eye, with active choroidal neovascularization: Secondary | ICD-10-CM | POA: Diagnosis not present

## 2017-03-20 DIAGNOSIS — H353211 Exudative age-related macular degeneration, right eye, with active choroidal neovascularization: Secondary | ICD-10-CM | POA: Diagnosis not present

## 2017-04-03 DIAGNOSIS — H353122 Nonexudative age-related macular degeneration, left eye, intermediate dry stage: Secondary | ICD-10-CM | POA: Diagnosis not present

## 2017-04-03 DIAGNOSIS — H35033 Hypertensive retinopathy, bilateral: Secondary | ICD-10-CM | POA: Diagnosis not present

## 2017-04-03 DIAGNOSIS — H348322 Tributary (branch) retinal vein occlusion, left eye, stable: Secondary | ICD-10-CM | POA: Diagnosis not present

## 2017-04-03 DIAGNOSIS — H353211 Exudative age-related macular degeneration, right eye, with active choroidal neovascularization: Secondary | ICD-10-CM | POA: Diagnosis not present

## 2017-06-10 DIAGNOSIS — I714 Abdominal aortic aneurysm, without rupture: Secondary | ICD-10-CM | POA: Diagnosis not present

## 2017-06-10 DIAGNOSIS — E7800 Pure hypercholesterolemia, unspecified: Secondary | ICD-10-CM | POA: Diagnosis not present

## 2017-06-10 DIAGNOSIS — B0059 Other herpesviral disease of eye: Secondary | ICD-10-CM | POA: Diagnosis not present

## 2017-06-10 DIAGNOSIS — M810 Age-related osteoporosis without current pathological fracture: Secondary | ICD-10-CM | POA: Diagnosis not present

## 2017-06-10 DIAGNOSIS — Z0000 Encounter for general adult medical examination without abnormal findings: Secondary | ICD-10-CM | POA: Diagnosis not present

## 2017-06-10 DIAGNOSIS — J449 Chronic obstructive pulmonary disease, unspecified: Secondary | ICD-10-CM | POA: Diagnosis not present

## 2017-06-10 DIAGNOSIS — F0390 Unspecified dementia without behavioral disturbance: Secondary | ICD-10-CM | POA: Diagnosis not present

## 2017-06-10 DIAGNOSIS — R0781 Pleurodynia: Secondary | ICD-10-CM | POA: Diagnosis not present

## 2017-06-10 DIAGNOSIS — Z131 Encounter for screening for diabetes mellitus: Secondary | ICD-10-CM | POA: Diagnosis not present

## 2017-06-11 ENCOUNTER — Other Ambulatory Visit: Payer: Self-pay | Admitting: Family Medicine

## 2017-06-11 DIAGNOSIS — I714 Abdominal aortic aneurysm, without rupture, unspecified: Secondary | ICD-10-CM

## 2017-06-16 ENCOUNTER — Ambulatory Visit
Admission: RE | Admit: 2017-06-16 | Discharge: 2017-06-16 | Disposition: A | Discharge status: Home / Self Care | Payer: Medicare Other | Source: Ambulatory Visit | Attending: Family Medicine | Admitting: Family Medicine

## 2017-06-16 DIAGNOSIS — I714 Abdominal aortic aneurysm, without rupture, unspecified: Secondary | ICD-10-CM

## 2017-07-24 DIAGNOSIS — Z961 Presence of intraocular lens: Secondary | ICD-10-CM | POA: Diagnosis not present

## 2017-07-24 DIAGNOSIS — H538 Other visual disturbances: Secondary | ICD-10-CM | POA: Diagnosis not present

## 2017-07-24 DIAGNOSIS — H3562 Retinal hemorrhage, left eye: Secondary | ICD-10-CM | POA: Diagnosis not present

## 2017-07-24 DIAGNOSIS — H04129 Dry eye syndrome of unspecified lacrimal gland: Secondary | ICD-10-CM | POA: Diagnosis not present

## 2018-05-16 ENCOUNTER — Emergency Department (HOSPITAL_COMMUNITY)
Admission: EM | Admit: 2018-05-16 | Discharge: 2018-05-16 | Disposition: A | Discharge status: Home / Self Care | Payer: Medicare Other | Attending: Emergency Medicine | Admitting: Emergency Medicine

## 2018-05-16 ENCOUNTER — Emergency Department (HOSPITAL_COMMUNITY): Payer: Medicare Other

## 2018-05-16 ENCOUNTER — Encounter (HOSPITAL_COMMUNITY): Payer: Self-pay

## 2018-05-16 ENCOUNTER — Other Ambulatory Visit: Payer: Self-pay

## 2018-05-16 DIAGNOSIS — I714 Abdominal aortic aneurysm, without rupture: Secondary | ICD-10-CM | POA: Insufficient documentation

## 2018-05-16 DIAGNOSIS — I10 Essential (primary) hypertension: Secondary | ICD-10-CM | POA: Diagnosis not present

## 2018-05-16 DIAGNOSIS — Z79899 Other long term (current) drug therapy: Secondary | ICD-10-CM | POA: Insufficient documentation

## 2018-05-16 DIAGNOSIS — Y999 Unspecified external cause status: Secondary | ICD-10-CM | POA: Diagnosis not present

## 2018-05-16 DIAGNOSIS — S0990XA Unspecified injury of head, initial encounter: Secondary | ICD-10-CM | POA: Diagnosis not present

## 2018-05-16 DIAGNOSIS — W0110XA Fall on same level from slipping, tripping and stumbling with subsequent striking against unspecified object, initial encounter: Secondary | ICD-10-CM | POA: Diagnosis not present

## 2018-05-16 DIAGNOSIS — S59902A Unspecified injury of left elbow, initial encounter: Secondary | ICD-10-CM | POA: Diagnosis not present

## 2018-05-16 DIAGNOSIS — Z7982 Long term (current) use of aspirin: Secondary | ICD-10-CM | POA: Insufficient documentation

## 2018-05-16 DIAGNOSIS — Y939 Activity, unspecified: Secondary | ICD-10-CM | POA: Insufficient documentation

## 2018-05-16 DIAGNOSIS — M25551 Pain in right hip: Secondary | ICD-10-CM | POA: Diagnosis not present

## 2018-05-16 DIAGNOSIS — Y92000 Kitchen of unspecified non-institutional (private) residence as  the place of occurrence of the external cause: Secondary | ICD-10-CM | POA: Diagnosis not present

## 2018-05-16 DIAGNOSIS — W19XXXA Unspecified fall, initial encounter: Secondary | ICD-10-CM

## 2018-05-16 DIAGNOSIS — M25552 Pain in left hip: Secondary | ICD-10-CM | POA: Insufficient documentation

## 2018-05-16 DIAGNOSIS — E860 Dehydration: Secondary | ICD-10-CM | POA: Diagnosis not present

## 2018-05-16 DIAGNOSIS — S79911A Unspecified injury of right hip, initial encounter: Secondary | ICD-10-CM | POA: Diagnosis not present

## 2018-05-16 DIAGNOSIS — Z87891 Personal history of nicotine dependence: Secondary | ICD-10-CM | POA: Diagnosis not present

## 2018-05-16 DIAGNOSIS — F0390 Unspecified dementia without behavioral disturbance: Secondary | ICD-10-CM | POA: Insufficient documentation

## 2018-05-16 DIAGNOSIS — S79912A Unspecified injury of left hip, initial encounter: Secondary | ICD-10-CM | POA: Diagnosis not present

## 2018-05-16 DIAGNOSIS — M25522 Pain in left elbow: Secondary | ICD-10-CM | POA: Diagnosis not present

## 2018-05-16 LAB — BASIC METABOLIC PANEL
Anion gap: 8 (ref 5–15)
BUN: 16 mg/dL (ref 8–23)
CALCIUM: 8.8 mg/dL — AB (ref 8.9–10.3)
CO2: 28 mmol/L (ref 22–32)
CREATININE: 0.9 mg/dL (ref 0.44–1.00)
Chloride: 108 mmol/L (ref 98–111)
GFR calc Af Amer: >60 mL/min (ref >60)
GFR calc non Af Amer: 56 mL/min — ABNORMAL LOW (ref >60)
Glucose, Bld: 99 mg/dL (ref 70–99)
Potassium: 4.3 mmol/L (ref 3.5–5.1)
SODIUM: 144 mmol/L (ref 135–145)

## 2018-05-16 LAB — URINALYSIS, ROUTINE W REFLEX MICROSCOPIC
BILIRUBIN URINE: NEGATIVE
Bacteria, UA: NONE SEEN
Glucose, UA: NEGATIVE mg/dL
Hgb urine dipstick: NEGATIVE
KETONES UR: 80 mg/dL — AB
Leukocytes, UA: NEGATIVE
Nitrite: NEGATIVE
PH: 6 (ref 5.0–8.0)
Protein, ur: 30 mg/dL — AB
Specific Gravity, Urine: 1.021 (ref 1.005–1.030)

## 2018-05-16 LAB — CBC WITH DIFFERENTIAL/PLATELET
BASOS ABS: 0.1 10*3/uL (ref 0.0–0.1)
Basophils Relative: 1 %
EOS ABS: 0.1 10*3/uL (ref 0.0–0.7)
EOS PCT: 1 %
HCT: 40.6 % (ref 36.0–46.0)
Hemoglobin: 13 g/dL (ref 12.0–15.0)
Lymphocytes Relative: 8 %
Lymphs Abs: 1.1 10*3/uL (ref 0.7–4.0)
MCH: 27.8 pg (ref 26.0–34.0)
MCHC: 32 g/dL (ref 30.0–36.0)
MCV: 86.9 fL (ref 78.0–100.0)
Monocytes Absolute: 1.1 10*3/uL — ABNORMAL HIGH (ref 0.1–1.0)
Monocytes Relative: 8 %
Neutro Abs: 11.1 10*3/uL — ABNORMAL HIGH (ref 1.7–7.7)
Neutrophils Relative %: 82 %
PLATELETS: 312 10*3/uL (ref 150–400)
RBC: 4.67 MIL/uL (ref 3.87–5.11)
RDW: 14.5 % (ref 11.5–15.5)
WBC: 13.5 10*3/uL — AB (ref 4.0–10.5)

## 2018-05-16 MED ORDER — ACETAMINOPHEN 325 MG PO TABS
650.0000 mg | ORAL_TABLET | Freq: Once | ORAL | Status: AC
  Administered 2018-05-16: 650 mg via ORAL
  Filled 2018-05-16: qty 2

## 2018-05-16 MED ORDER — SODIUM CHLORIDE 0.9 % IV BOLUS
500.0000 mL | Freq: Once | INTRAVENOUS | Status: AC
  Administered 2018-05-16: 500 mL via INTRAVENOUS

## 2018-05-16 NOTE — ED Notes (Signed)
Pt aware urine sample needed 

## 2018-05-16 NOTE — ED Triage Notes (Signed)
Walk in reports fall Thursday, c/o left hip and elbow pain. Pt can walk and stand but pain on movement. Presents with no deformity or obvious injury. Has small area of ecchymosis on left elbow.

## 2018-05-16 NOTE — Discharge Instructions (Signed)
Return if any problems.

## 2018-05-16 NOTE — ED Provider Notes (Signed)
[facility] DEPT Provider Note   CSN: [id number] Arrival date & time: 05/16/18  1044     History   Chief Complaint Chief Complaint  Patient presents with  . Hip Pain  . Arm Pain    HPI Amanda Hinton is a 84 y.o. female with a reported history of dementia, COPD, hypertension, hyperlipidemia, osteoporosis who presents emergency department today for fall.  Patient daughters at bedside helps provide history.  They report that the patient was at home on Thursday when she fell in the kitchen and hit her head.  She is unsure why she fell.  She denies any preceding symptoms.  She denies any preceding visual changes, focal weakness, vertigo, chest pain, shortness of breath, abdominal pain.  She notes after the event she is been having bilateral hip pain that is worse on the left as well as left elbow pain.  She has associated ecchymosis on the left elbow.  She also notes that she hit her head.  She is unsure if she lost consciousness.  She states she does take a 81 mg aspirin daily.  No other blood thinner use.  Since the event she denies any headache, visual changes, hearing changes, difficulty with speech, facial droop, neck pain, low back pain, chest pain, shortness breath, palpitations, abdominal pain or urinary symptoms.  Patient is at baseline per daughter.   HPI  Past Medical History:  Diagnosis Date  . AAA (abdominal aortic aneurysm) ([place]) 2014  . COPD (chronic obstructive pulmonary disease) ([place])   . Hyperlipemia   . Hypertension   . Osteoporosis     Patient Active Problem List   Diagnosis Date Noted  . PNEUMONIA ORGANISM NOS 01/24/2011  . HYPERLIPIDEMIA 01/23/2011  . HYPERTENSION 01/23/2011  . COPD  GOLD II 01/23/2011  . OSTEOPOROSIS 01/23/2011    Past Surgical History:  Procedure Laterality Date  . APPENDECTOMY  1968  . biopsy of uterus  1968 and 1985  . COLONOSCOPY  01/2010     OB History   None      Home Medications     Prior to Admission medications   Medication Sig Start Date End Date Taking? Authorizing Provider  aspirin 81 MG tablet Take 81 mg by mouth daily.      [provider]  b complex vitamins tablet Take 1 tablet by mouth daily.    [provider]  cycloSPORINE (RESTASIS) 0.05 % ophthalmic emulsion Place 1 drop into both eyes 2 (two) times daily.    [provider]  docusate sodium (COLACE) 100 MG capsule Take 100 mg by mouth daily as needed for mild constipation.    [provider]  Ginkgo 60 MG TABS Take 1 tablet by mouth as directed.    [provider]  Multiple Vitamins-Minerals (MULTIVITAMIN WITH MINERALS) tablet Take 1 tablet by mouth daily.      [provider]  polyethylene glycol (MIRALAX / GLYCOLAX) packet 1/2 capful with water daily     [provider]  Umeclidinium Bromide (INCRUSE ELLIPTA) 62.5 MCG/INH AEPB Inhale 1 puff into the lungs every morning. 02/03/15   Tanda Rockers, MD  valACYclovir (VALTREX) 500 MG tablet Take 500 mg by mouth daily.      [provider]    Family History Family History  Problem Relation Age of Onset  . Heart disease Mother     Social History Social History   Tobacco Use  . Smoking status: Former Smoker    Packs/day:  1.00    Years: 54.00    Pack years: 54.00    Types: Cigarettes    Last attempt to quit: 12/19/2010    Years since quitting: 7.4  . Smokeless tobacco: Never Used  . Tobacco comment: quit 2012  Substance Use Topics  . Alcohol use: No    Frequency: Never  . Drug use: No     Allergies   Avelox [moxifloxacin hcl in nacl] and Prednisone   Review of Systems Review of Systems  All other systems reviewed and are negative.    Physical Exam Updated Vital Signs BP (!) 151/83   Pulse 93   Temp 98.1 F (36.7 C)   Resp (!) 23   SpO2 94%   Physical Exam  Constitutional: She appears well-developed and well-nourished.  HENT:  Head: Normocephalic and  atraumatic.  Right Ear: External ear normal.  Left Ear: External ear normal.  Nose: Nose normal.  Mouth/Throat: Uvula is midline, oropharynx is clear and moist and mucous membranes are normal. No tonsillar exudate.  Eyes: Pupils are equal, round, and reactive to light. Right eye exhibits no discharge. Left eye exhibits no discharge. No scleral icterus.  Neck: Trachea normal. Neck supple. No spinous process tenderness present. No neck rigidity. Normal range of motion present.  Cardiovascular: Normal rate, regular rhythm and intact distal pulses.  No murmur heard. Pulses:      Radial pulses are 2+ on the right side, and 2+ on the left side.       Dorsalis pedis pulses are 2+ on the right side, and 2+ on the left side.       Posterior tibial pulses are 2+ on the right side, and 2+ on the left side.  No lower extremity swelling or edema. Calves symmetric in size bilaterally.  Pulmonary/Chest: Effort normal and breath sounds normal. She exhibits no tenderness.  Abdominal: Soft. Bowel sounds are normal. There is no tenderness. There is no rebound and no guarding.  Musculoskeletal: She exhibits no edema.       Left elbow: She exhibits normal range of motion, no swelling, no effusion, no deformity and no laceration. Tenderness found.       Right hip: She exhibits normal range of motion.  Mild ecchymosis under the posterior aspect of the left elbow.  Tenderness to palpation of her bilateral hips.  There is no shortening or external rotation of bilateral legs.  She has passive range of motion that is intact.  There is no sacral crepitus.  There is negative logroll test.  Compartments are soft and she is neurovascular intact.  Lymphadenopathy:    She has no cervical adenopathy.  Neurological: She is alert.  Speech clear. Follows commands. No facial droop. PERRLA. EOMI. Normal peripheral fields. CN III-XII intact.  Grossly moves all extremities 4 without ataxia. Coordination intact. Able and  appropriate strength for age to upper and lower extremities bilaterally including grip strength & plantar flexion/dorsiflexion.  No drift of the lower extremities.  Sensation to light touch intact bilaterally for upper and lower. Patellar deep tendon reflex 2+ and equal bilaterally. Normal finger to nose. No pronator drift. Normal gait.   Skin: Skin is warm and dry. No rash noted. She is not diaphoretic.  Psychiatric: She has a normal mood and affect.  Nursing note and vitals reviewed.   ED Treatments / Results  Labs (all labs ordered are listed, but only abnormal results are displayed) Labs Reviewed  BASIC METABOLIC PANEL - Abnormal; Notable for the following components:  Result Value   Calcium 8.8 (*)    GFR calc non Af Amer 56 (*)    All other components within normal limits  CBC WITH DIFFERENTIAL/PLATELET - Abnormal; Notable for the following components:   WBC 13.5 (*)    Neutro Abs 11.1 (*)    Monocytes Absolute 1.1 (*)    All other components within normal limits  URINALYSIS, ROUTINE W REFLEX MICROSCOPIC    EKG EKG Interpretation  Date/Time:  Saturday May 16 2018 12:26:33 EDT Ventricular Rate:  90 PR Interval:    QRS Duration: 83 QT Interval:  392 QTC Calculation: 480 R Axis:   49 Text Interpretation:  Sinus rhythm RSR' in V1 or V2, probably normal variant Nonspecific T abnormalities, lateral leads similar to prior 2/12 Confirmed by Aletta Edouard [phone]) on 28-Jun-202019 12:34:52 PM   Radiology Dg Elbow Complete Left  Result Date: 28-Jun-202019 CLINICAL DATA:  Fall 2 days ago with pain. EXAM: LEFT ELBOW - COMPLETE 3+ VIEW COMPARISON:  None. FINDINGS: There is no evidence of fracture, dislocation, or joint effusion. There is no evidence of arthropathy or other focal bone abnormality. Soft tissues are unremarkable. IMPRESSION: Negative. Electronically Signed   By: Dorise Bullion III M.D   On: 028-Jun-202019 12:40   Ct Head Wo Contrast  Result Date: 28-Jun-202019 CLINICAL  DATA:  Patient status post fall on Thursday. EXAM: CT HEAD WITHOUT CONTRAST TECHNIQUE: Contiguous axial images were obtained from the base of the skull through the vertex without intravenous contrast. COMPARISON:  None. FINDINGS: Brain: Ventricles and sulci are mildly prominent compatible with atrophy. Periventricular and subcortical white matter hypodensity compatible with chronic microvascular ischemic changes. No evidence for acute cortically based infarct, intracranial hemorrhage, mass lesion or mass-effect. Vascular: Internal carotid arterial vascular calcifications. Skull: Intact Sinuses/Orbits: Paranasal sinuses are well aerated. Mastoid air cells unremarkable. Other: None. IMPRESSION: No acute intracranial process. Atrophy and chronic microvascular ischemic changes. Electronically Signed   By: Lovey Newcomer M.D.   On: 028-Jun-202019 12:43   Dg Hips Bilat W Or Wo Pelvis 3-4 Views  Result Date: 28-Jun-202019 CLINICAL DATA:  Status post fall with hip pain. EXAM: DG HIP (WITH OR WITHOUT PELVIS) 3-4V BILAT COMPARISON:  None. FINDINGS: There is no evidence of hip fracture or dislocation. There is no evidence of arthropathy or other focal bone abnormality. IMPRESSION: Negative. Electronically Signed   By: Fidela Salisbury M.D.   On: 028-Jun-202019 12:39    Procedures Procedures (including critical care time)  Medications Ordered in ED Medications - No data to display   Initial Impression / Assessment and Plan / ED Course  I have reviewed the triage vital signs and the nursing notes.  Pertinent labs & imaging results that were available during my care of the patient were reviewed by me and considered in my medical decision making (see chart for details).  Clinical Course as of May 17 1503  Sat May 16, 2912  [id number] 84 year old female with history of dementia here after a fall 2 days ago at home.  Since then she has had increased difficulty with transfers and also noted some bruising in the left elbow.  Here  she is getting some baseline labs and x-rays of her head and plain films of her hips and elbow.  If these are negative and the family is able to care for her home potentially she can be discharged home.   [MB]    Clinical Course User Index [MB] Hayden Rasmussen, MD    84 year old female with  a history of dementia presenting here for fall that occurred 2 days ago.  Patient notes increased pain with standing.  She has had able gait.  She denies any neurologic symptoms.  She does know when she fell she hit her head.  She reports bruising to her left elbow.  She denies any low back pain.  No bowel/bladder incontinence.  Patient with reassuring exam as above.  She is neurologically intact.  Will obtain x-rays to evaluate arthralgias.  Will also obtain screening labs.  Anticipate discharge home if results are reassuring.  Vital signs are reassuring.  CT and x-ray are reassuring. EKG similar to prior. Labs are reassuring.  With UA pending, case signed out to Alyse Low, PA-C with anticipation for discharge home.  Patient case seen and discussed with Dr. Melina Copa who is in agreement with plan.   Final Clinical Impressions(s) / ED Diagnoses   Final diagnoses:  Fall, initial encounter  Minor head injury, initial encounter  Right hip pain  Left hip pain    ED Discharge Orders    None       Lorelle Gibbs 05/16/18 1604    Hayden Rasmussen, MD 05/16/18 1900

## 2018-06-05 DIAGNOSIS — R531 Weakness: Secondary | ICD-10-CM | POA: Diagnosis not present

## 2018-06-05 DIAGNOSIS — W19XXXA Unspecified fall, initial encounter: Secondary | ICD-10-CM | POA: Diagnosis not present

## 2018-06-05 DIAGNOSIS — R030 Elevated blood-pressure reading, without diagnosis of hypertension: Secondary | ICD-10-CM | POA: Diagnosis not present

## 2018-06-29 DIAGNOSIS — F0390 Unspecified dementia without behavioral disturbance: Secondary | ICD-10-CM | POA: Diagnosis not present

## 2018-06-29 DIAGNOSIS — J449 Chronic obstructive pulmonary disease, unspecified: Secondary | ICD-10-CM | POA: Diagnosis not present

## 2018-06-29 DIAGNOSIS — E7800 Pure hypercholesterolemia, unspecified: Secondary | ICD-10-CM | POA: Diagnosis not present

## 2018-06-29 DIAGNOSIS — I700 Atherosclerosis of aorta: Secondary | ICD-10-CM | POA: Diagnosis not present

## 2018-06-29 DIAGNOSIS — B0059 Other herpesviral disease of eye: Secondary | ICD-10-CM | POA: Diagnosis not present

## 2018-06-29 DIAGNOSIS — M25559 Pain in unspecified hip: Secondary | ICD-10-CM | POA: Diagnosis not present

## 2018-06-29 DIAGNOSIS — Z131 Encounter for screening for diabetes mellitus: Secondary | ICD-10-CM | POA: Diagnosis not present

## 2018-06-29 DIAGNOSIS — I714 Abdominal aortic aneurysm, without rupture: Secondary | ICD-10-CM | POA: Diagnosis not present

## 2018-06-29 DIAGNOSIS — Z0000 Encounter for general adult medical examination without abnormal findings: Secondary | ICD-10-CM | POA: Diagnosis not present

## 2018-07-06 ENCOUNTER — Other Ambulatory Visit: Payer: Self-pay | Admitting: Family Medicine

## 2018-07-07 ENCOUNTER — Other Ambulatory Visit: Payer: Self-pay | Admitting: Family Medicine

## 2018-07-07 ENCOUNTER — Ambulatory Visit
Admission: RE | Admit: 2018-07-07 | Discharge: 2018-07-07 | Disposition: A | Discharge status: Home / Self Care | Payer: Medicare Other | Source: Ambulatory Visit | Attending: Family Medicine | Admitting: Family Medicine

## 2018-07-07 ENCOUNTER — Other Ambulatory Visit: Payer: Self-pay

## 2018-07-07 DIAGNOSIS — S3210XA Unspecified fracture of sacrum, initial encounter for closed fracture: Secondary | ICD-10-CM | POA: Diagnosis not present

## 2018-07-07 DIAGNOSIS — M25551 Pain in right hip: Secondary | ICD-10-CM

## 2018-12-22 DIAGNOSIS — R2689 Other abnormalities of gait and mobility: Secondary | ICD-10-CM | POA: Diagnosis not present

## 2018-12-22 DIAGNOSIS — R29898 Other symptoms and signs involving the musculoskeletal system: Secondary | ICD-10-CM | POA: Diagnosis not present

## 2018-12-22 DIAGNOSIS — L602 Onychogryphosis: Secondary | ICD-10-CM | POA: Diagnosis not present

## 2019-01-06 DIAGNOSIS — R2681 Unsteadiness on feet: Secondary | ICD-10-CM | POA: Diagnosis not present

## 2019-01-06 DIAGNOSIS — F0390 Unspecified dementia without behavioral disturbance: Secondary | ICD-10-CM | POA: Diagnosis not present

## 2019-01-06 DIAGNOSIS — I700 Atherosclerosis of aorta: Secondary | ICD-10-CM | POA: Diagnosis not present

## 2019-01-06 DIAGNOSIS — I714 Abdominal aortic aneurysm, without rupture: Secondary | ICD-10-CM | POA: Diagnosis not present

## 2019-01-06 DIAGNOSIS — R159 Full incontinence of feces: Secondary | ICD-10-CM | POA: Diagnosis not present

## 2019-01-06 DIAGNOSIS — M216X9 Other acquired deformities of unspecified foot: Secondary | ICD-10-CM | POA: Diagnosis not present

## 2019-01-06 DIAGNOSIS — M810 Age-related osteoporosis without current pathological fracture: Secondary | ICD-10-CM | POA: Diagnosis not present

## 2019-01-06 DIAGNOSIS — J449 Chronic obstructive pulmonary disease, unspecified: Secondary | ICD-10-CM | POA: Diagnosis not present

## 2019-01-06 DIAGNOSIS — Z5181 Encounter for therapeutic drug level monitoring: Secondary | ICD-10-CM | POA: Diagnosis not present

## 2019-01-06 DIAGNOSIS — N393 Stress incontinence (female) (male): Secondary | ICD-10-CM | POA: Diagnosis not present

## 2019-01-06 DIAGNOSIS — R030 Elevated blood-pressure reading, without diagnosis of hypertension: Secondary | ICD-10-CM | POA: Diagnosis not present

## 2019-01-11 ENCOUNTER — Emergency Department (HOSPITAL_COMMUNITY): Payer: Medicare Other

## 2019-01-11 ENCOUNTER — Other Ambulatory Visit: Payer: Self-pay

## 2019-01-11 ENCOUNTER — Inpatient Hospital Stay (HOSPITAL_COMMUNITY)
Admission: EM | Admit: 2019-01-11 | Discharge: 2019-01-16 | DRG: 871 | Disposition: A | Discharge status: Skilled Nursing Facility | Payer: Medicare Other | Attending: Family Medicine | Admitting: Family Medicine

## 2019-01-11 DIAGNOSIS — Z8249 Family history of ischemic heart disease and other diseases of the circulatory system: Secondary | ICD-10-CM | POA: Diagnosis not present

## 2019-01-11 DIAGNOSIS — R2689 Other abnormalities of gait and mobility: Secondary | ICD-10-CM | POA: Diagnosis not present

## 2019-01-11 DIAGNOSIS — I4891 Unspecified atrial fibrillation: Secondary | ICD-10-CM | POA: Diagnosis not present

## 2019-01-11 DIAGNOSIS — M810 Age-related osteoporosis without current pathological fracture: Secondary | ICD-10-CM | POA: Diagnosis present

## 2019-01-11 DIAGNOSIS — Z87891 Personal history of nicotine dependence: Secondary | ICD-10-CM | POA: Diagnosis not present

## 2019-01-11 DIAGNOSIS — F05 Delirium due to known physiological condition: Secondary | ICD-10-CM | POA: Diagnosis present

## 2019-01-11 DIAGNOSIS — J440 Chronic obstructive pulmonary disease with acute lower respiratory infection: Secondary | ICD-10-CM | POA: Diagnosis present

## 2019-01-11 DIAGNOSIS — I10 Essential (primary) hypertension: Secondary | ICD-10-CM | POA: Diagnosis present

## 2019-01-11 DIAGNOSIS — J811 Chronic pulmonary edema: Secondary | ICD-10-CM | POA: Diagnosis not present

## 2019-01-11 DIAGNOSIS — D649 Anemia, unspecified: Secondary | ICD-10-CM | POA: Diagnosis not present

## 2019-01-11 DIAGNOSIS — A419 Sepsis, unspecified organism: Secondary | ICD-10-CM | POA: Diagnosis not present

## 2019-01-11 DIAGNOSIS — K551 Chronic vascular disorders of intestine: Secondary | ICD-10-CM | POA: Diagnosis not present

## 2019-01-11 DIAGNOSIS — B961 Klebsiella pneumoniae [K. pneumoniae] as the cause of diseases classified elsewhere: Secondary | ICD-10-CM | POA: Diagnosis present

## 2019-01-11 DIAGNOSIS — R000 Tachycardia, unspecified: Secondary | ICD-10-CM | POA: Diagnosis not present

## 2019-01-11 DIAGNOSIS — E785 Hyperlipidemia, unspecified: Secondary | ICD-10-CM | POA: Diagnosis present

## 2019-01-11 DIAGNOSIS — R4182 Altered mental status, unspecified: Secondary | ICD-10-CM | POA: Diagnosis not present

## 2019-01-11 DIAGNOSIS — I480 Paroxysmal atrial fibrillation: Secondary | ICD-10-CM | POA: Diagnosis not present

## 2019-01-11 DIAGNOSIS — Z888 Allergy status to other drugs, medicaments and biological substances status: Secondary | ICD-10-CM

## 2019-01-11 DIAGNOSIS — G9341 Metabolic encephalopathy: Secondary | ICD-10-CM | POA: Diagnosis present

## 2019-01-11 DIAGNOSIS — A4152 Sepsis due to Pseudomonas: Principal | ICD-10-CM | POA: Diagnosis present

## 2019-01-11 DIAGNOSIS — Z7982 Long term (current) use of aspirin: Secondary | ICD-10-CM

## 2019-01-11 DIAGNOSIS — M6281 Muscle weakness (generalized): Secondary | ICD-10-CM | POA: Diagnosis not present

## 2019-01-11 DIAGNOSIS — N390 Urinary tract infection, site not specified: Secondary | ICD-10-CM | POA: Diagnosis present

## 2019-01-11 DIAGNOSIS — Z79899 Other long term (current) drug therapy: Secondary | ICD-10-CM

## 2019-01-11 DIAGNOSIS — J9601 Acute respiratory failure with hypoxia: Secondary | ICD-10-CM | POA: Diagnosis present

## 2019-01-11 DIAGNOSIS — Z1611 Resistance to penicillins: Secondary | ICD-10-CM | POA: Diagnosis not present

## 2019-01-11 DIAGNOSIS — R918 Other nonspecific abnormal finding of lung field: Secondary | ICD-10-CM | POA: Diagnosis not present

## 2019-01-11 DIAGNOSIS — I714 Abdominal aortic aneurysm, without rupture, unspecified: Secondary | ICD-10-CM

## 2019-01-11 DIAGNOSIS — J181 Lobar pneumonia, unspecified organism: Secondary | ICD-10-CM | POA: Diagnosis not present

## 2019-01-11 DIAGNOSIS — R1312 Dysphagia, oropharyngeal phase: Secondary | ICD-10-CM | POA: Diagnosis not present

## 2019-01-11 DIAGNOSIS — R0902 Hypoxemia: Secondary | ICD-10-CM | POA: Diagnosis not present

## 2019-01-11 DIAGNOSIS — Z7401 Bed confinement status: Secondary | ICD-10-CM | POA: Diagnosis not present

## 2019-01-11 DIAGNOSIS — R6520 Severe sepsis without septic shock: Secondary | ICD-10-CM | POA: Diagnosis present

## 2019-01-11 DIAGNOSIS — J449 Chronic obstructive pulmonary disease, unspecified: Secondary | ICD-10-CM | POA: Diagnosis not present

## 2019-01-11 DIAGNOSIS — R32 Unspecified urinary incontinence: Secondary | ICD-10-CM | POA: Diagnosis present

## 2019-01-11 DIAGNOSIS — R509 Fever, unspecified: Secondary | ICD-10-CM | POA: Diagnosis not present

## 2019-01-11 DIAGNOSIS — K5730 Diverticulosis of large intestine without perforation or abscess without bleeding: Secondary | ICD-10-CM | POA: Diagnosis not present

## 2019-01-11 DIAGNOSIS — R7881 Bacteremia: Secondary | ICD-10-CM | POA: Diagnosis not present

## 2019-01-11 DIAGNOSIS — R404 Transient alteration of awareness: Secondary | ICD-10-CM | POA: Diagnosis not present

## 2019-01-11 DIAGNOSIS — Z452 Encounter for adjustment and management of vascular access device: Secondary | ICD-10-CM | POA: Diagnosis not present

## 2019-01-11 DIAGNOSIS — M8448XA Pathological fracture, other site, initial encounter for fracture: Secondary | ICD-10-CM | POA: Diagnosis present

## 2019-01-11 DIAGNOSIS — J189 Pneumonia, unspecified organism: Secondary | ICD-10-CM | POA: Diagnosis present

## 2019-01-11 DIAGNOSIS — M84454A Pathological fracture, pelvis, initial encounter for fracture: Secondary | ICD-10-CM | POA: Diagnosis present

## 2019-01-11 DIAGNOSIS — R2681 Unsteadiness on feet: Secondary | ICD-10-CM | POA: Diagnosis not present

## 2019-01-11 DIAGNOSIS — R6510 Systemic inflammatory response syndrome (SIRS) of non-infectious origin without acute organ dysfunction: Secondary | ICD-10-CM | POA: Diagnosis present

## 2019-01-11 DIAGNOSIS — R41841 Cognitive communication deficit: Secondary | ICD-10-CM | POA: Diagnosis not present

## 2019-01-11 DIAGNOSIS — I69828 Other speech and language deficits following other cerebrovascular disease: Secondary | ICD-10-CM | POA: Diagnosis not present

## 2019-01-11 DIAGNOSIS — Z1639 Resistance to other specified antimicrobial drug: Secondary | ICD-10-CM | POA: Diagnosis not present

## 2019-01-11 DIAGNOSIS — M2550 Pain in unspecified joint: Secondary | ICD-10-CM | POA: Diagnosis not present

## 2019-01-11 DIAGNOSIS — Z95828 Presence of other vascular implants and grafts: Secondary | ICD-10-CM

## 2019-01-11 LAB — COMPREHENSIVE METABOLIC PANEL
ALK PHOS: 76 U/L (ref 38–126)
ALT: 9 U/L (ref 0–44)
AST: 31 U/L (ref 15–41)
Albumin: 3 g/dL — ABNORMAL LOW (ref 3.5–5.0)
Anion gap: 8 (ref 5–15)
BUN: 18 mg/dL (ref 8–23)
CALCIUM: 8.6 mg/dL — AB (ref 8.9–10.3)
CO2: 26 mmol/L (ref 22–32)
CREATININE: 0.99 mg/dL (ref 0.44–1.00)
Chloride: 104 mmol/L (ref 98–111)
GFR, EST AFRICAN AMERICAN: 59 mL/min — AB (ref >60)
GFR, EST NON AFRICAN AMERICAN: 51 mL/min — AB (ref >60)
Glucose, Bld: 98 mg/dL (ref 70–99)
Potassium: 4.1 mmol/L (ref 3.5–5.1)
SODIUM: 138 mmol/L (ref 135–145)
Total Bilirubin: 1.1 mg/dL (ref 0.3–1.2)
Total Protein: 6.3 g/dL — ABNORMAL LOW (ref 6.5–8.1)

## 2019-01-11 LAB — CBC WITH DIFFERENTIAL/PLATELET
Abs Immature Granulocytes: 0.13 10*3/uL — ABNORMAL HIGH (ref 0.00–0.07)
BASOS ABS: 0.1 10*3/uL (ref 0.0–0.1)
Basophils Relative: 0 %
EOS ABS: 0.1 10*3/uL (ref 0.0–0.5)
EOS PCT: 1 %
HEMATOCRIT: 37.6 % (ref 36.0–46.0)
HEMOGLOBIN: 11.8 g/dL — AB (ref 12.0–15.0)
Immature Granulocytes: 1 %
LYMPHS ABS: 0.3 10*3/uL — AB (ref 0.7–4.0)
Lymphocytes Relative: 2 %
MCH: 27.9 pg (ref 26.0–34.0)
MCHC: 31.4 g/dL (ref 30.0–36.0)
MCV: 88.9 fL (ref 80.0–100.0)
MONO ABS: 0.6 10*3/uL (ref 0.1–1.0)
MONOS PCT: 3 %
NRBC: 0 % (ref 0.0–0.2)
Neutro Abs: 18.6 10*3/uL — ABNORMAL HIGH (ref 1.7–7.7)
Neutrophils Relative %: 93 %
Platelets: 211 10*3/uL (ref 150–400)
RBC: 4.23 MIL/uL (ref 3.87–5.11)
RDW: 13.9 % (ref 11.5–15.5)
WBC: 19.8 10*3/uL — ABNORMAL HIGH (ref 4.0–10.5)

## 2019-01-11 LAB — URINALYSIS, ROUTINE W REFLEX MICROSCOPIC
Bilirubin Urine: NEGATIVE
Glucose, UA: NEGATIVE mg/dL
KETONES UR: 20 mg/dL — AB
Nitrite: POSITIVE — AB
PROTEIN: NEGATIVE mg/dL
RBC / HPF: >50 RBC/hpf — ABNORMAL HIGH (ref 0–5)
Specific Gravity, Urine: >1.046 — ABNORMAL HIGH (ref 1.005–1.030)
pH: 5 (ref 5.0–8.0)

## 2019-01-11 LAB — POCT I-STAT EG7
Bicarbonate: 25.8 mmol/L (ref 20.0–28.0)
CALCIUM ION: 1.08 mmol/L — AB (ref 1.15–1.40)
HEMATOCRIT: 30 % — AB (ref 36.0–46.0)
Hemoglobin: 10.2 g/dL — ABNORMAL LOW (ref 12.0–15.0)
O2 Saturation: 92 %
POTASSIUM: 4.2 mmol/L (ref 3.5–5.1)
Patient temperature: 37
Sodium: 135 mmol/L (ref 135–145)
TCO2: 27 mmol/L (ref 22–32)
pCO2, Ven: 44.4 mmHg (ref 44.0–60.0)
pH, Ven: 7.372 (ref 7.250–7.430)
pO2, Ven: 66 mmHg — ABNORMAL HIGH (ref 32.0–45.0)

## 2019-01-11 LAB — INFLUENZA PANEL BY PCR (TYPE A & B)
Influenza A By PCR: NEGATIVE
Influenza B By PCR: NEGATIVE

## 2019-01-11 LAB — LACTIC ACID, PLASMA: Lactic Acid, Venous: 1.3 mmol/L (ref 0.5–1.9)

## 2019-01-11 MED ORDER — GUAIFENESIN ER 600 MG PO TB12
600.0000 mg | ORAL_TABLET | Freq: Two times a day (BID) | ORAL | Status: DC
  Administered 2019-01-12 – 2019-01-16 (×10): 600 mg via ORAL
  Filled 2019-01-11 (×10): qty 1

## 2019-01-11 MED ORDER — ONDANSETRON HCL 4 MG/2ML IJ SOLN: 4.0000 mg | Freq: Four times a day (QID) | INTRAMUSCULAR | Status: DC | PRN

## 2019-01-11 MED ORDER — VALACYCLOVIR HCL 500 MG PO TABS: 500.0000 mg | ORAL_TABLET | Freq: Every day | ORAL | Status: DC

## 2019-01-11 MED ORDER — SODIUM CHLORIDE 0.9 % IV SOLN
2.0000 g | INTRAVENOUS | Status: DC
  Filled 2019-01-11: qty 2

## 2019-01-11 MED ORDER — CYCLOSPORINE 0.05 % OP EMUL
1.0000 [drp] | Freq: Two times a day (BID) | OPHTHALMIC | Status: DC
  Administered 2019-01-12 – 2019-01-16 (×9): 1 [drp] via OPHTHALMIC
  Filled 2019-01-11 (×10): qty 30

## 2019-01-11 MED ORDER — POLYETHYLENE GLYCOL 3350 17 G PO PACK
17.0000 g | PACK | Freq: Every day | ORAL | Status: DC | PRN
  Administered 2019-01-15 – 2019-01-16 (×2): 17 g via ORAL
  Filled 2019-01-11 (×2): qty 1

## 2019-01-11 MED ORDER — ENOXAPARIN SODIUM 40 MG/0.4ML ~~LOC~~ SOLN
40.0000 mg | SUBCUTANEOUS | Status: DC
  Administered 2019-01-12 – 2019-01-13 (×2): 40 mg via SUBCUTANEOUS
  Filled 2019-01-11 (×2): qty 0.4

## 2019-01-11 MED ORDER — SODIUM CHLORIDE 0.9 % IV SOLN
2.0000 g | Freq: Once | INTRAVENOUS | Status: AC
  Administered 2019-01-11: 2 g via INTRAVENOUS
  Filled 2019-01-11: qty 2

## 2019-01-11 MED ORDER — ACETAMINOPHEN 500 MG PO TABS
1000.0000 mg | ORAL_TABLET | Freq: Once | ORAL | Status: AC
  Administered 2019-01-11: 1000 mg via ORAL
  Filled 2019-01-11: qty 2

## 2019-01-11 MED ORDER — SODIUM CHLORIDE 0.9 % IV SOLN
INTRAVENOUS | Status: AC
  Administered 2019-01-12: 02:00:00 via INTRAVENOUS

## 2019-01-11 MED ORDER — METRONIDAZOLE IN NACL 5-0.79 MG/ML-% IV SOLN
500.0000 mg | Freq: Three times a day (TID) | INTRAVENOUS | Status: DC
  Administered 2019-01-11 – 2019-01-12 (×3): 500 mg via INTRAVENOUS
  Filled 2019-01-11 (×3): qty 100

## 2019-01-11 MED ORDER — ACETAMINOPHEN 325 MG PO TABS
650.0000 mg | ORAL_TABLET | Freq: Four times a day (QID) | ORAL | Status: DC | PRN
  Administered 2019-01-12 – 2019-01-16 (×6): 650 mg via ORAL
  Filled 2019-01-11 (×3): qty 2

## 2019-01-11 MED ORDER — IPRATROPIUM BROMIDE 0.02 % IN SOLN
0.5000 mg | Freq: Four times a day (QID) | RESPIRATORY_TRACT | Status: DC
  Administered 2019-01-12: 0.5 mg via RESPIRATORY_TRACT
  Filled 2019-01-11: qty 2.5

## 2019-01-11 MED ORDER — SODIUM CHLORIDE 0.9 % IV BOLUS: 1000.0000 mL | Freq: Once | INTRAVENOUS | Status: DC

## 2019-01-11 MED ORDER — ASPIRIN 81 MG PO CHEW
81.0000 mg | CHEWABLE_TABLET | Freq: Every day | ORAL | Status: DC
  Administered 2019-01-12 – 2019-01-13 (×2): 81 mg via ORAL
  Filled 2019-01-11 (×2): qty 1

## 2019-01-11 MED ORDER — ACETAMINOPHEN 650 MG RE SUPP: 650.0000 mg | Freq: Four times a day (QID) | RECTAL | Status: DC | PRN

## 2019-01-11 MED ORDER — ONDANSETRON HCL 4 MG PO TABS: 4.0000 mg | ORAL_TABLET | Freq: Four times a day (QID) | ORAL | Status: DC | PRN

## 2019-01-11 MED ORDER — IOHEXOL 300 MG/ML  SOLN
100.0000 mL | Freq: Once | INTRAMUSCULAR | Status: AC | PRN
  Administered 2019-01-11: 100 mL via INTRAVENOUS

## 2019-01-11 MED ORDER — DOCUSATE SODIUM 100 MG PO CAPS
100.0000 mg | ORAL_CAPSULE | Freq: Every day | ORAL | Status: DC | PRN
  Administered 2019-01-14 – 2019-01-15 (×2): 100 mg via ORAL
  Filled 2019-01-11 (×2): qty 1

## 2019-01-11 MED ORDER — BISACODYL 10 MG RE SUPP
10.0000 mg | Freq: Every day | RECTAL | Status: DC | PRN
  Administered 2019-01-16: 10 mg via RECTAL
  Filled 2019-01-11: qty 1

## 2019-01-11 MED ORDER — VANCOMYCIN HCL IN DEXTROSE 750-5 MG/150ML-% IV SOLN: 750.0000 mg | INTRAVENOUS | Status: DC

## 2019-01-11 MED ORDER — SODIUM CHLORIDE 0.9 % IV BOLUS
1000.0000 mL | Freq: Once | INTRAVENOUS | Status: AC
  Administered 2019-01-11: 1000 mL via INTRAVENOUS

## 2019-01-11 MED ORDER — HYDROCODONE-ACETAMINOPHEN 5-325 MG PO TABS
1.0000 | ORAL_TABLET | ORAL | Status: DC | PRN
  Administered 2019-01-12: 1 via ORAL
  Filled 2019-01-11: qty 1

## 2019-01-11 MED ORDER — ALBUTEROL SULFATE (2.5 MG/3ML) 0.083% IN NEBU
2.5000 mg | INHALATION_SOLUTION | RESPIRATORY_TRACT | Status: DC | PRN
  Administered 2019-01-12: 2.5 mg via RESPIRATORY_TRACT
  Filled 2019-01-11: qty 3

## 2019-01-11 MED ORDER — DONEPEZIL HCL 5 MG PO TABS
5.0000 mg | ORAL_TABLET | Freq: Every day | ORAL | Status: DC
  Administered 2019-01-12 – 2019-01-16 (×5): 5 mg via ORAL
  Filled 2019-01-11 (×5): qty 1

## 2019-01-11 MED ORDER — VANCOMYCIN HCL IN DEXTROSE 1-5 GM/200ML-% IV SOLN
1000.0000 mg | Freq: Once | INTRAVENOUS | Status: AC
  Administered 2019-01-11: 1000 mg via INTRAVENOUS
  Filled 2019-01-11: qty 200

## 2019-01-11 NOTE — H&P (Signed)
Amanda Hinton EXH:[id number] DOB: [birth year] DOA: 01/11/2019     PCP: Lawerance Cruel, MD   Outpatient Specialists:  NONE    Patient arrived to ER on 01/11/19 at 1330  Patient coming from: home Lives alone,        Chief Complaint:  Chief Complaint  Patient presents with  . Altered Mental Status    HPI: Amanda Hinton is a 85 y.o. female with medical history significant of dementia, hypertension, hyperlipidemia, COPD, osteoporosis and AAA,    Presented with confusion since this morning incontinent of urine.  EMS she lives at home by herself was able to call ambulance.  Patient's daughter states she was fine last evening when she saw her but has been more confused today patient have has problems with dementia and sundowning at night in the past. When daughter came to her to see her today she felt warm to the touch patient herself states she has not been feeling well but cannot tell what is wrong.  She did not report any cough or shortness of breath or chest pain.  She did notice some dysuria and pain in the lower abdomen.  Daughter states that yesterday she had some flank pain bilaterally.  Otherwise no diarrhea.  Had an episode of vomiting this morning.  No headaches  Family been trying to get home health at home at night  She have had some cough more productive over past 3 month no wheezing  regarding pertinent Chronic problems:    HTN now improved she is no longer on any meds COPD - have been more of an issue LATELY She has been coughing more While in ER: Noted to be hypoxic down to 89% room air placed on 2 L of oxygen Noted to be febrile in the ER up to 103.3 with increased respirations up to 21 white blood cell count 19.8 Meeting sepsis criteria Blood cultures were obtained as well as lactic acid UA ordered influenza panel ordered  Code sepsis initiated patient start IV antibiotics Cefepime metronidazole vancomycin The following Work up has been ordered so  far:  Orders Placed This Encounter  Procedures  . Culture, blood (routine x 2)  . Urine culture  . DG Chest 2 View  . CT ABDOMEN PELVIS W CONTRAST  . Comprehensive metabolic panel  . CBC with Differential  . Urinalysis, Routine w reflex microscopic  . Lactic acid, plasma  . Influenza panel by PCR (type A & B)  . Cardiac monitoring  . In and Out Cath  . ceFEPime (MAXIPIME) per pharmacy consult  . vancomycin per pharmacy consult  . Consult to hospitalist  . Droplet precaution  . EKG 12-Lead  . ED EKG     Following Medications were ordered in ER: Medications  metroNIDAZOLE (FLAGYL) IVPB 500 mg (500 mg Intravenous New Bag/Given 01/11/19 1857)  ceFEPIme (MAXIPIME) 2 g in sodium chloride 0.9 % 100 mL IVPB (has no administration in time range)  vancomycin (VANCOCIN) IVPB 750 mg/150 ml premix (has no administration in time range)  acetaminophen (TYLENOL) tablet 1,000 mg (1,000 mg Oral Given 01/11/19 1422)  sodium chloride 0.9 % bolus 1,000 mL (0 mLs Intravenous Stopped 01/11/19 1856)  ceFEPIme (MAXIPIME) 2 g in sodium chloride 0.9 % 100 mL IVPB (0 g Intravenous Stopped 01/11/19 1856)  vancomycin (VANCOCIN) IVPB 1000 mg/200 mL premix (0 mg Intravenous Stopped 01/11/19 1856)  iohexol (OMNIPAQUE) 300 MG/ML solution 100 mL (100 mLs Intravenous Contrast Given 01/11/19 1524)    Significant  initial  Findings: Abnormal Labs Reviewed  COMPREHENSIVE METABOLIC PANEL - Abnormal; Notable for the following components:      Result Value   Calcium 8.6 (*)    Total Protein 6.3 (*)    Albumin 3.0 (*)    GFR calc non Af Amer 51 (*)    GFR calc Af Amer 59 (*)    All other components within normal limits  CBC WITH DIFFERENTIAL/PLATELET - Abnormal; Notable for the following components:   WBC 19.8 (*)    Hemoglobin 11.8 (*)    Neutro Abs 18.6 (*)    Lymphs Abs 0.3 (*)    Abs Immature Granulocytes 0.13 (*)    All other components within normal limits     Lactic Acid, Venous    Component  Value Date/Time   LATICACIDVEN 1.3 01/11/2019 1400    Na 138 K 4.1  Cr   Stable,  Lab Results  Component Value Date   CREATININE 0.99 01/11/2019   CREATININE 0.90 November 02, 202019   CREATININE 0.73 12/30/2010      HG/HCT ,  Down  from baseline see below    Component Value Date/Time   HGB 11.8 (L) 01/11/2019 1350   HCT 37.6 01/11/2019 1350       Troponin (Point of Care Test) No results for input(s): TROPIPOC in the last 72 hours.       UA few bacteria and some leukocytes, positive nitrites      CXR -subtle right upper lobe infiltrate  CTabd/pelvis -  nonacute  ECG:  Personally reviewed by me showing: HR : 1000 Rhythm: Sinus tachycardia    nonspecific changes,  QTC 445      ED Triage Vitals  Enc Vitals Group     BP 01/11/19 1340 (!) 157/66     Pulse Rate 01/11/19 1340 92     Resp 01/11/19 1340 (!) 26     Temp 01/11/19 1400 (!) 103.3 F (39.6 C)     Temp Source 01/11/19 1400 Rectal     SpO2 01/11/19 1340 (!) 89 %     Weight 01/11/19 1338 130 lb (59 kg)     Height 01/11/19 1338 5\' 6"  (1.676 m)     Head Circumference --      Peak Flow --      Pain Score 01/11/19 1337 0     Pain Loc --      Pain Edu? --      Excl. in [place]? --   TMAX(24)@       Latest  Blood pressure (!) 127/54, pulse 81, temperature 98.5 F (36.9 C), temperature source Oral, resp. rate (!) 27, height 5\' 6"  (1.676 m), weight 59 kg, SpO2 98 %.      Hospitalist was called for admission for CAP resulting in SEPSIS   Review of Systems:    Pertinent positives include: Fevers, chills, fatigue, shortness of breath at rest.   dyspnea on exertion, excess mucus,productive cough  Constitutional:  No weight loss, night sweats,  weight loss  HEENT:  No headaches, Difficulty swallowing,Tooth/dental problems,Sore throat,  No sneezing, itching, ear ache, nasal congestion, post nasal drip,  Cardio-vascular:  No chest pain, Orthopnea, PND, anasarca, dizziness, palpitations.no Bilateral lower  extremity swelling  GI:  No heartburn, indigestion, abdominal pain, nausea, vomiting, diarrhea, change in bowel habits, loss of appetite, melena, blood in stool, hematemesis Resp:   No coughing up of blood.No change in color of mucus.No wheezing. Skin:  no rash or lesions. No jaundice GU:  no dysuria, change in color of urine, no urgency or frequency. No straining to urinate.  No flank pain.  Musculoskeletal:  No joint pain or no joint swelling. No decreased range of motion. No back pain.  Psych:  No change in mood or affect. No depression or anxiety. No memory loss.  Neuro: no localizing neurological complaints, no tingling, no weakness, no double vision, no gait abnormality, no slurred speech, no confusion  All systems reviewed and apart from [place] all are negative  Past Medical History:   Past Medical History:  Diagnosis Date  . AAA (abdominal aortic aneurysm) ([place]) 2014  . COPD (chronic obstructive pulmonary disease) ([place])   . Hyperlipemia   . Hypertension   . Osteoporosis       Past Surgical History:  Procedure Laterality Date  . APPENDECTOMY  1968  . biopsy of uterus  1968 and 1985  . COLONOSCOPY  01/2010    Social History:  Ambulatory   Independently   reports that she quit smoking about 8 years ago. Her smoking use included cigarettes. She has a 54.00 pack-year smoking history. She has never used smokeless tobacco. She reports that she does not drink alcohol or use drugs.     Family History:   Family History  Problem Relation Age of Onset  . Heart disease Mother     Allergies: Allergies  Allergen Reactions  . Avelox [Moxifloxacin Hcl In Nacl] Other (See Comments)    Prolonged QT wave  . Prednisone Other (See Comments)    REACTION: Disoriented     Prior to Admission medications   Medication Sig Start Date End Date Taking? Authorizing Provider  aspirin 81 MG tablet Take 81 mg by mouth daily.     Yes [provider]  b complex vitamins tablet  Take 1 tablet by mouth daily.   Yes [provider]  cycloSPORINE (RESTASIS) 0.05 % ophthalmic emulsion Place 1 drop into both eyes 2 (two) times daily.   Yes [provider]  docusate sodium (COLACE) 100 MG capsule Take 100 mg by mouth daily as needed for mild constipation.   Yes [provider]  donepezil (ARICEPT) 5 MG tablet Take 5 mg by mouth daily. 03/20/18  Yes [provider]  Ginkgo 60 MG TABS Take 1 tablet by mouth as directed.   Yes [provider]  Multiple Vitamins-Minerals (MULTIVITAMIN WITH MINERALS) tablet Take 1 tablet by mouth daily.     Yes [provider]  polyethylene glycol (MIRALAX / GLYCOLAX) packet Take 17 g by mouth daily as needed for mild constipation.    Yes [provider]  Potassium Chloride (KLOR-CON PO) Take 1 tablet by mouth daily.   Yes [provider]  valACYclovir (VALTREX) 500 MG tablet Take 500 mg by mouth daily.     Yes [provider]  Umeclidinium Bromide (INCRUSE ELLIPTA) 62.5 MCG/INH AEPB Inhale 1 puff into the lungs every morning. Patient not taking: Reported on 2020/03/1418 02/03/15   Tanda Rockers, MD   Physical Exam: Blood pressure (!) 127/54, pulse 81, temperature 98.5 F (36.9 C), temperature source Oral, resp. rate (!) 27, height 5\' 6"  (1.676 m), weight 59 kg, SpO2 98 %. 1. General:  in No  Acute distress   well  -appearing 2. Psychological: Alert and   Oriented to self  3. Head/ENT:    Dry Mucous Membranes  Head Non traumatic, neck supple                           Poor Dentition 4. SKIN:  decreased Skin turgor,  Skin clean Dry and intact no rash 5. Heart: Regular rate and rhythm no  Murmur, no Rub or gallop 6. Lungs:   no wheezes some crackles   7. Abdomen: Soft,  non-tender, Non distended   obese  bowel sounds present 8. Lower extremities: no clubbing, cyanosis, no  edema 9. Neurologically Grossly intact, moving all 4 extremities equally     10. MSK: Normal range of motion   LABS:     Recent Labs  Lab 01/11/19 1350  WBC 19.8*  NEUTROABS 18.6*  HGB 11.8*  HCT 37.6  MCV 88.9  PLT 786   Basic Metabolic Panel: Recent Labs  Lab 01/11/19 1350  NA 138  K 4.1  CL 104  CO2 26  GLUCOSE 98  BUN 18  CREATININE 0.99  CALCIUM 8.6*      Recent Labs  Lab 01/11/19 1350  AST 31  ALT 9  ALKPHOS 76  BILITOT 1.1  PROT 6.3*  ALBUMIN 3.0*   No results for input(s): LIPASE, AMYLASE in the last 168 hours. No results for input(s): AMMONIA in the last 168 hours.    HbA1C: No results for input(s): HGBA1C in the last 72 hours. CBG: No results for input(s): GLUCAP in the last 168 hours.    Urine analysis:    Component Value Date/Time   COLORURINE YELLOW 2020/09/218 1823   APPEARANCEUR CLEAR 2020/09/218 1823   LABSPEC 1.021 2020/09/218 1823   PHURINE 6.0 2020/09/218 1823   GLUCOSEU NEGATIVE 2020/09/218 1823   HGBUR NEGATIVE 2020/09/218 1823   BILIRUBINUR NEGATIVE 2020/09/218 1823   KETONESUR 80 (A) 2020/09/218 1823   PROTEINUR 30 (A) 2020/09/218 1823   NITRITE NEGATIVE 2020/09/218 1823   LEUKOCYTESUR NEGATIVE 2020/09/218 1823       Cultures:    Component Value Date/Time   SDES SPUTUM SITE_NOT_APPLICABL 76/72/0947 0962   SPECREQUEST IMMUNE:NORM 12/28/2010 0913   CULT NORMAL OROPHARYNGEAL FLORA 12/28/2010 0913   REPTSTATUS 12/30/2010 FINAL 12/28/2010 0913     Radiological Exams on Admission: Dg Chest 2 View  Result Date: 01/11/2019 CLINICAL DATA:  Altered mental status, incontinence and possible pneumonia. EXAM: CHEST - 2 VIEW COMPARISON:  11/07/2015 FINDINGS: The heart size is at the upper limits of normal. There may be a subtle right upper lobe infiltrate. Chronic lung disease present with potential mild interstitial edema. No pleural effusions or evidence of pneumothorax. IMPRESSION: Possible subtle right upper lobe infiltrate. Chronic lung disease with potential mild interstitial edema. Electronically Signed    By: Aletta Edouard M.D.   On: 01/11/2019 16:14   Ct Abdomen Pelvis W Contrast  Result Date: 01/11/2019 CLINICAL DATA:  85 year old female with confusion. Fever. Known aneurysm which is stable. Prior appendectomy. Initial encounter. EXAM: CT ABDOMEN AND PELVIS WITH CONTRAST TECHNIQUE: Multidetector CT imaging of the abdomen and pelvis was performed using the standard protocol following bolus administration of intravenous contrast. CONTRAST:  14mL OMNIPAQUE IOHEXOL 300 MG/ML  SOLN COMPARISON:  Pelvic CT 07/07/2018. 06/16/2017 abdominal aortic ultrasound. FINDINGS: Lower chest: Basilar parenchymal changes suggestive of scarring/atelectasis. Peribronchial thickening left lower lobe may represent chronic changes although can not exclude component of bronchitis. Left base small calcified granuloma. Cardiomegaly. Mitral valve calcification. Coronary artery calcification. Hepatobiliary: Motion degraded exam. No worrisome hepatic lesion. No calcified gallstones. Limited for evaluating  for gallbladder inflammation secondary to motion artifact. Pancreas: Motion degraded exam. No worrisome pancreatic mass or inflammation noted. Spleen: Motion degraded exam causes artifact near the hilum. Adrenals/Urinary Tract: Motion degraded exam. No obstructing stone or hydronephrosis. No worrisome renal mass. Right lower pole 7 mm nonobstructing stone. No worrisome adrenal lesion. Noncontrast filled views of the urinary bladder unremarkable. Stomach/Bowel: Significant sigmoid diverticula and muscular hypertrophy. Slightly engorged vessels within the sigmoid mesentery but without discrete evidence of diverticulitis. Other portions of bowel are limited by motion degradation however, no extraluminal bowel inflammatory process noted. Moderate-size hiatal hernia. Partially fluid-filled normal size small bowel loops. Vascular/Lymphatic: Prominent atherosclerotic changes abdominal aorta and aortic branch vessels. Infrarenal abdominal aortic  aneurysm measuring up to 3.2 cm versus 3 cm on prior ultrasound. Prominent narrowing origin of the celiac artery, superior mesenteric artery, inferior mesenteric artery and renal arteries. Significant narrowing iliac arteries. Scattered normal size lymph nodes. Reproductive: No worrisome uterine or adnexal mass. Other: No free intraperitoneal air or bowel containing hernia. Musculoskeletal: Remote left pubic body fracture with incomplete healing. Remote sacral fractures. Prominent scoliosis lower thoracic and lumbar spine convex left with superimposed degenerative changes. IMPRESSION: 1. Exam is motion degraded which limits evaluation for detection of an inflammatory process (particularly at the level of the gallbladder and upper abdomen). Taking this limitation into account, no discrete inflammatory process is noted. 2. Significant sigmoid diverticulosis with muscular hypertrophy. Slightly engorged vessels within the sigmoid mesentery but without discrete findings of diverticulitis. 3. Moderate size hiatal hernia. 4. Peribronchial thickening left lower lobe may represent chronic changes although can not exclude component of bronchitis. Basilar atelectasis/scarring. 5. Cardiomegaly.  Coronary artery calcification. 6. Aortic Atherosclerosis (ICD10-I70.0). Abdominal aortic aneurysm measures 3.2 cm versus prior 3 cm. Recommend followup by ultrasound in 3 years. This recommendation follows ACR consensus guidelines: White Paper of the ACR Incidental Findings Committee II on Vascular Findings. J Am Coll Radiol 2013; 16:109-604 7. Prominent narrowing origin of the celiac artery, superior mesenteric artery, inferior mesenteric artery and renal arteries. Significant narrowing iliac arteries. 8. Remote left pubic body fracture with incomplete healing. Remote sacral fractures. 9. Prominent scoliosis lumbar spine convex left with superimposed degenerative changes. Electronically Signed   By: Genia Del M.D.   On: 01/11/2019  17:31    Chart has been reviewed    Assessment/Plan  84 y.o. female with medical history significant of dementia, hypertension, hyperlipidemia, COPD, osteoporosis and AAA,  Admitted for CAP resulting in sepsis and acute respiratory failure  Present on Admission: . SIRS (systemic inflammatory response syndrome) (HCC) most likely source being pneumonia although UTI is not completely excluded.  For now continue broad-spectrum antibiotics simplify as able to tolerate  . CAP (community acquired pneumonia) -  - will admit for treatment of CAP will start on appropriate antibiotic coverage.   Obtain:  sputum cultures,                  Obtain respiratory panel and influenza serologies                  blood cultures if febrile or if decompensates.                   strep pneumo UA antigen,                 Provide oxygen as needed.   . Hyperlipidemia, unspecified chronic stable . Essential hypertension chronic currently no longer taking any blood pressure medications . COPD  GOLD II currently  no wheezing continue home medications and monitor respiratory status . Acute respiratory failure with hypoxia (HCC) likely secondary to pneumonia.  We will continue oxygen prior to discharge will need to have PT   evaluate for hypoxia with ambulation  Dementia with sundowning -new home medications supportive management expect some degree of worsening delirium while hospitalized Other plan as per orders.  DVT prophylaxis:   Lovenox     Code Status:  FULL CODE   as per patient   I had personally discussed CODE STATUS with patient    Family Communication:   Family   at  Bedside  plan of care was discussed with  Daughter,  Disposition Plan:         To home once workup is complete and patient is stable                      Would benefit from PT/OT eval prior to DC  Ordered                                        Consults called: none   Admission status:   inpatient     Expect 2 midnight stay  secondary to severity of patient's current illness including   hemodynamic instability despite optimal treatment (  tachypnea  Hypoxia)    Severe lab/radiological/exam abnormalities including:  PNA   and extensive comorbidities including: COPD   dementia  That are currently affecting medical management.   I expect  patient to be hospitalized for 2 midnights requiring inpatient medical care.  Patient is at high risk for adverse outcome (such as loss of life or disability) if not treated.  Indication for inpatient stay as follows:  Severe change from baseline regarding mental status Hemodynamic instability despite maximal medical therapy,       New or worsening hypoxia  Need for IV antibiotics, IV fluids,      Level of care      medical floor          Toy Baker 01/11/2019, 9:16 PM    [facility]     after 2 AM please page floor coverage PA If 7AM-7PM, please contact the day team taking care of the patient using [email or web address]

## 2019-01-11 NOTE — Progress Notes (Signed)
Pharmacy Antibiotic Note  Amanda Hinton is a 85 y.o. female admitted on 01/11/2019 with sepsis.  Pharmacy has been consulted for vancomycin and cefepime dosing.  One time doses ordered.   Plan: Start cefepime 2g IV Q24h Start vancomycin 750mg  IV Q24h Monitor clinical picture, renal function, vanc levels prn F/U C&S, abx deescalation / LOT   Height: 5\' 6"  (167.6 cm) Weight: 130 lb (59 kg) IBW/kg (Calculated) : 59.3  Temp (24hrs), Avg:103.3 F (39.6 C), Min:103.3 F (39.6 C), Max:103.3 F (39.6 C)  Recent Labs  Lab 01/11/19 1350 01/11/19 1400  WBC 19.8*  --   CREATININE 0.99  --   LATICACIDVEN  --  1.3    Estimated Creatinine Clearance: 37.3 mL/min (by C-G formula based on SCr of 0.99 mg/dL).    Allergies  Allergen Reactions  . Avelox [Moxifloxacin Hcl In Nacl]     Prolonged QT wave  . Prednisone     REACTION: Disoriented    Thank you for allowing pharmacy to be a part of this patient's care.  Reginia Naas 01/11/2019 3:18 PM

## 2019-01-11 NOTE — ED Notes (Signed)
Pt is 89% on room air at this time. Placed on 2L [place].

## 2019-01-11 NOTE — ED Notes (Signed)
Patient transported to CT 

## 2019-01-11 NOTE — ED Notes (Signed)
Attempted to in and out cath patient x2. Unable to obtain urine. Pt placed back on purewick.

## 2019-01-11 NOTE — ED Triage Notes (Signed)
Pt BIB [facility]. Pt lives at home by herself. Pt's daughter called EMS reporting that patient appeared confused this morning. Pt was incontinent of urine for EMS. Pt is alert and oriented to person and place at this time. Pt denies any pain at this time. Pt's daughter reports that the patient was fine last evening when she saw her, but that she has been working on trying to get home health at home for the patient due to her "sundowning" at night per EMS.

## 2019-01-11 NOTE — ED Provider Notes (Signed)
[place] EMERGENCY DEPARTMENT Provider Note   CSN: [id number] Arrival date & time: 01/11/19  1330    History   Chief Complaint Chief Complaint  Patient presents with  . Altered Mental Status    HPI Amanda Hinton is a 85 y.o. female.     Amanda Hinton is a 85 y.o. female with a history of dementia, hypertension, hyperlipidemia, COPD, osteoporosis and AAA, who presents to the emergency department via EMS for evaluation of altered mental status.  Patient lives at home by herself.  Per EMS patient's daughter called the patient this morning and she seemed much more confused than normal.  When daughter arrived at her house she felt very warm to the touch and was continuing to act confused and not like herself so she called EMS.  She reports last night when she spoke to the patient she seemed normal.  The patient reports that today she has not been feeling well but is not sure what is wrong.  She denies cough, shortness of breath or chest pain.  She is unsure if she is having abdominal pain but has noted some pain and burning with urination.  Daughter notes that yesterday she was complaining of some flank pain bilaterally.  She denies any diarrhea.  Has not had any episodes of vomiting this morning.  She denies any headache, vision changes or focal weakness.  History from patient is limited, although she is alert and cooperative throughout interaction.  Patient's daughter reports that they have been working to try and get home health as the patient tends to sundown in the evening.     Past Medical History:  Diagnosis Date  . AAA (abdominal aortic aneurysm) ([place]) 2014  . COPD (chronic obstructive pulmonary disease) ([place])   . Hyperlipemia   . Hypertension   . Osteoporosis     Patient Active Problem List   Diagnosis Date Noted  . PNEUMONIA ORGANISM NOS 01/24/2011  . HYPERLIPIDEMIA 01/23/2011  . HYPERTENSION 01/23/2011  . COPD  GOLD II 01/23/2011  .  OSTEOPOROSIS 01/23/2011    Past Surgical History:  Procedure Laterality Date  . APPENDECTOMY  1968  . biopsy of uterus  1968 and 1985  . COLONOSCOPY  01/2010     OB History   No obstetric history on file.      Home Medications    Prior to Admission medications   Medication Sig Start Date End Date Taking? Authorizing Provider  aspirin 81 MG tablet Take 81 mg by mouth daily.      [provider]  b complex vitamins tablet Take 1 tablet by mouth daily.    [provider]  cycloSPORINE (RESTASIS) 0.05 % ophthalmic emulsion Place 1 drop into both eyes 2 (two) times daily.    [provider]  docusate sodium (COLACE) 100 MG capsule Take 100 mg by mouth daily as needed for mild constipation.    [provider]  donepezil (ARICEPT) 5 MG tablet Take 5 mg by mouth daily. 03/20/18   [provider]  Ginkgo 60 MG TABS Take 1 tablet by mouth as directed.    [provider]  Multiple Vitamins-Minerals (MULTIVITAMIN WITH MINERALS) tablet Take 1 tablet by mouth daily.      [provider]  polyethylene glycol (MIRALAX / GLYCOLAX) packet 1/2 capful with water daily     [provider]  Umeclidinium Bromide (INCRUSE ELLIPTA) 62.5 MCG/INH AEPB Inhale 1 puff into the lungs every morning. Patient not taking:  Reported on 04/24/202019 02/03/15   Tanda Rockers, MD  valACYclovir (VALTREX) 500 MG tablet Take 500 mg by mouth daily.      [provider]    Family History Family History  Problem Relation Age of Onset  . Heart disease Mother     Social History Social History   Tobacco Use  . Smoking status: Former Smoker    Packs/day: 1.00    Years: 54.00    Pack years: 54.00    Types: Cigarettes    Last attempt to quit: 12/19/2010    Years since quitting: 8.0  . Smokeless tobacco: Never Used  . Tobacco comment: quit 2012  Substance Use Topics  . Alcohol use: No    Frequency: Never  . Drug use: No     Allergies     Avelox [moxifloxacin hcl in nacl] and Prednisone   Review of Systems Review of Systems  Constitutional: Positive for chills and fever.  HENT: Negative.   Eyes: Negative for visual disturbance.  Respiratory: Negative for cough, shortness of breath and wheezing.   Cardiovascular: Negative for chest pain and leg swelling.  Gastrointestinal: Positive for abdominal pain. Negative for blood in stool, diarrhea, nausea and vomiting.  Genitourinary: Positive for dysuria, flank pain and frequency.  Musculoskeletal: Negative for arthralgias and myalgias.  Skin: Negative for color change and rash.  Neurological: Negative for dizziness, facial asymmetry, speech difficulty, weakness, light-headedness, numbness and headaches.  Psychiatric/Behavioral: Positive for confusion.     Physical Exam Updated Vital Signs BP (!) 124/58   Pulse 86   Temp (!) 103.3 F (39.6 C) (Rectal)   Resp (!) 21   Ht 5\' 6"  (1.676 m)   Wt 59 kg   SpO2 99%   BMI 20.98 kg/m   Physical Exam Vitals signs and nursing note reviewed.  Constitutional:      General: She is not in acute distress.    Appearance: Normal appearance. She is normal weight. She is not diaphoretic.  HENT:     Head: Normocephalic and atraumatic.     Nose: Nose normal.     Mouth/Throat:     Mouth: Mucous membranes are dry.     Pharynx: Oropharynx is clear.     Comments: Mucous membranes slightly dry, posterior oropharynx clear. Eyes:     Extraocular Movements: Extraocular movements intact.     Pupils: Pupils are equal, round, and reactive to light.  Neck:     Musculoskeletal: Neck supple.  Cardiovascular:     Rate and Rhythm: Regular rhythm. Tachycardia present.     Pulses: Normal pulses.     Heart sounds: Normal heart sounds. No murmur. No friction rub. No gallop.   Pulmonary:     Effort: Pulmonary effort is normal. No respiratory distress.     Breath sounds: Normal breath sounds.     Comments: Respirations equal and unlabored on 2 L  nasal cannula, patient satting at 89% on room air on arrival, patient able to speak in full sentences, lungs clear to auscultation bilaterally Abdominal:     General: Abdomen is flat. Bowel sounds are normal. There is no distension.     Palpations: Abdomen is soft. There is no mass.     Tenderness: There is abdominal tenderness. There is no guarding.     Comments: Abdomen is soft, nondistended, bowel sounds present throughout, there is some generalized tenderness throughout the abdomen, no focal area of pain or guarding, no rigidity.  Musculoskeletal:  General: No deformity.     Right lower leg: No edema.     Left lower leg: No edema.  Skin:    General: Skin is warm and dry.     Capillary Refill: Capillary refill takes less than 2 seconds.  Neurological:     Mental Status: She is alert.  Psychiatric:        Mood and Affect: Mood normal.        Behavior: Behavior normal.      ED Treatments / Results  Labs (all labs ordered are listed, but only abnormal results are displayed) Labs Reviewed  COMPREHENSIVE METABOLIC PANEL - Abnormal; Notable for the following components:      Result Value   Calcium 8.6 (*)    Total Protein 6.3 (*)    Albumin 3.0 (*)    GFR calc non Af Amer 51 (*)    GFR calc Af Amer 59 (*)    All other components within normal limits  CBC WITH DIFFERENTIAL/PLATELET - Abnormal; Notable for the following components:   WBC 19.8 (*)    Hemoglobin 11.8 (*)    Neutro Abs 18.6 (*)    Lymphs Abs 0.3 (*)    Abs Immature Granulocytes 0.13 (*)    All other components within normal limits  CULTURE, BLOOD (ROUTINE X 2)  CULTURE, BLOOD (ROUTINE X 2)  URINE CULTURE  LACTIC ACID, PLASMA  URINALYSIS, ROUTINE W REFLEX MICROSCOPIC  LACTIC ACID, PLASMA  INFLUENZA PANEL BY PCR (TYPE A & B)    EKG EKG Interpretation  Date/Time:  Monday January 11 2019 13:38:12 EST Ventricular Rate:  100 PR Interval:    QRS Duration: 85 QT Interval:  345 QTC Calculation: 445 R  Axis:   50 Text Interpretation:  Sinus tachycardia Atrial premature complexes in couplets Nonspecific T abnrm, anterolateral leads Confirmed by Davonna Belling [phone]) on 01/11/2019 1:52:55 PM   Radiology No results found.  Procedures Procedures (including critical care time)  Medications Ordered in ED Medications  ceFEPIme (MAXIPIME) 2 g in sodium chloride 0.9 % 100 mL IVPB (has no administration in time range)  metroNIDAZOLE (FLAGYL) IVPB 500 mg (has no administration in time range)  vancomycin (VANCOCIN) IVPB 1000 mg/200 mL premix (has no administration in time range)  ceFEPIme (MAXIPIME) 2 g in sodium chloride 0.9 % 100 mL IVPB (has no administration in time range)  vancomycin (VANCOCIN) IVPB 750 mg/150 ml premix (has no administration in time range)  acetaminophen (TYLENOL) tablet 1,000 mg (1,000 mg Oral Given 01/11/19 1422)  sodium chloride 0.9 % bolus 1,000 mL (1,000 mLs Intravenous New Bag/Given 01/11/19 1422)  iohexol (OMNIPAQUE) 300 MG/ML solution 100 mL (100 mLs Intravenous Contrast Given 01/11/19 1524)     Initial Impression / Assessment and Plan / ED Course  I have reviewed the triage vital signs and the nursing notes.  Pertinent labs & imaging results that were available during my care of the patient were reviewed by me and considered in my medical decision making (see chart for details).  85 year old female presents for evaluation of altered mental status.  On arrival she is febrile to 103.3, mildly tachycardic and tachypneic.  She was initially satting 89% on room air, patient does have history of COPD so this is not entirely surprising but placed on 2 L nasal cannula with improvement.  Patient does meet Sirs criteria but is not in severe sepsis.  Lungs are overall clear.  Abdomen does have some generalized tenderness and patient endorses some urinary symptoms.  Concerning for intra-abdominal infection versus UTI or Pyelonephritis.  We will begin sepsis work-up with lactic  acid, blood cultures, basic labs, urinalysis and culture, chest x-ray, EKG and CT abdomen pelvis given abdominal tenderness.  Will start patient on a liter of IV fluids, given the patient is not hypotensive will hold off on 30/kg bolus at this time.  Tylenol for fever, broad-spectrum antibiotics ordered.  Labs significant for leukocytosis of 19.8 with left shift, hemoglobin of 11.8.  No significant electrolyte derangements requiring intervention, normal renal and liver function.  Lactic acid not elevated at 1.3.  Blood cultures and flu swab collected.  Awaiting urinalysis, chest x-ray and CT of the abdomen and pelvis.  EKG shows sinus tachycardia with a few atrial premature complexes.  At shift change care signed out to [place], who will follow-up on patient's imaging, patient will need to be admitted for sepsis once source is determined.  Patient discussed with Dr. Alvino Chapel, who saw patient as well and agrees with plan.  Final Clinical Impressions(s) / ED Diagnoses   Final diagnoses:  Altered mental status, unspecified altered mental status type  Fever, unspecified fever cause  Sepsis, due to unspecified organism, unspecified whether acute organ dysfunction present [facility])    ED Discharge Orders    None       Janet Berlin 01/11/19 1613    Davonna Belling, MD 01/12/19 [phone]

## 2019-01-11 NOTE — ED Provider Notes (Signed)
3:12 PM BP (!) 124/58   Pulse 86   Temp (!) 103.3 F (39.6 C) (Rectal)   Resp (!) 21   Ht 5\' 6"  (1.676 m)   Wt 59 kg   SpO2 99%   BMI 20.98 kg/m  85 y/o sepsis/ AMS abd pain Oriented to person/place  Patient CT scan shows chronic atherosclerotic changes in the abdomen.  She has a slightly enlarging AAA from 3 to 3.2 cm which will need outpatient follow-up.  I discussed the case with Dr. Roel Cluck will admit the patient for presumed sepsis.  Urine is still pending at this time.  He received broad-spectrum antibiotics.  Vital signs have been stable without significant drop in her pressures.  Results for orders placed or performed during the hospital encounter of 01/11/19  Comprehensive metabolic panel  Result Value Ref Range   Sodium 138 135 - 145 mmol/L   Potassium 4.1 3.5 - 5.1 mmol/L   Chloride 104 98 - 111 mmol/L   CO2 26 22 - 32 mmol/L   Glucose, Bld 98 70 - 99 mg/dL   BUN 18 8 - 23 mg/dL   Creatinine, Ser 0.99 0.44 - 1.00 mg/dL   Calcium 8.6 (L) 8.9 - 10.3 mg/dL   Total Protein 6.3 (L) 6.5 - 8.1 g/dL   Albumin 3.0 (L) 3.5 - 5.0 g/dL   AST 31 15 - 41 U/L   ALT 9 0 - 44 U/L   Alkaline Phosphatase 76 38 - 126 U/L   Total Bilirubin 1.1 0.3 - 1.2 mg/dL   GFR calc non Af Amer 51 (L) >60 mL/min   GFR calc Af Amer 59 (L) >60 mL/min   Anion gap 8 5 - 15  CBC with Differential  Result Value Ref Range   WBC 19.8 (H) 4.0 - 10.5 K/uL   RBC 4.23 3.87 - 5.11 MIL/uL   Hemoglobin 11.8 (L) 12.0 - 15.0 g/dL   HCT 37.6 36.0 - 46.0 %   MCV 88.9 80.0 - 100.0 fL   MCH 27.9 26.0 - 34.0 pg   MCHC 31.4 30.0 - 36.0 g/dL   RDW 13.9 11.5 - 15.5 %   Platelets 211 150 - 400 K/uL   nRBC 0.0 0.0 - 0.2 %   Neutrophils Relative % 93 %   Neutro Abs 18.6 (H) 1.7 - 7.7 K/uL   Lymphocytes Relative 2 %   Lymphs Abs 0.3 (L) 0.7 - 4.0 K/uL   Monocytes Relative 3 %   Monocytes Absolute 0.6 0.1 - 1.0 K/uL   Eosinophils Relative 1 %   Eosinophils Absolute 0.1 0.0 - 0.5 K/uL   Basophils Relative 0 %   Basophils Absolute 0.1 0.0 - 0.1 K/uL   WBC Morphology See Note    Immature Granulocytes 1 %   Abs Immature Granulocytes 0.13 (H) 0.00 - 0.07 K/uL  Urinalysis, Routine w reflex microscopic  Result Value Ref Range   Color, Urine YELLOW YELLOW   APPearance HAZY (A) CLEAR   Specific Gravity, Urine >1.046 (H) 1.005 - 1.030   pH 5.0 5.0 - 8.0   Glucose, UA NEGATIVE NEGATIVE mg/dL   Hgb urine dipstick MODERATE (A) NEGATIVE   Bilirubin Urine NEGATIVE NEGATIVE   Ketones, ur 20 (A) NEGATIVE mg/dL   Protein, ur NEGATIVE NEGATIVE mg/dL   Nitrite POSITIVE (A) NEGATIVE   Leukocytes,Ua SMALL (A) NEGATIVE   RBC / HPF >50 (H) 0 - 5 RBC/hpf   WBC, UA 11-20 0 - 5 WBC/hpf   Bacteria, UA FEW (A)  NONE SEEN   Squamous Epithelial / LPF 0-5 0 - 5   Mucus PRESENT   Lactic acid, plasma  Result Value Ref Range   Lactic Acid, Venous 1.3 0.5 - 1.9 mmol/L  Influenza panel by PCR (type A & B)  Result Value Ref Range   Influenza A By PCR NEGATIVE NEGATIVE   Influenza B By PCR NEGATIVE NEGATIVE  POCT I-Stat EG7  Result Value Ref Range   pH, Ven 7.372 7.250 - 7.430   pCO2, Ven 44.4 44.0 - 60.0 mmHg   pO2, Ven 66.0 (H) 32.0 - 45.0 mmHg   Bicarbonate 25.8 20.0 - 28.0 mmol/L   TCO2 27 22 - 32 mmol/L   O2 Saturation 92.0 %   Sodium 135 135 - 145 mmol/L   Potassium 4.2 3.5 - 5.1 mmol/L   Calcium, Ion 1.08 (L) 1.15 - 1.40 mmol/L   HCT 30.0 (L) 36.0 - 46.0 %   Hemoglobin 10.2 (L) 12.0 - 15.0 g/dL   Patient temperature 37.0 C    Sample type VENOUS       Margarita Mail, PA-C 01/11/19 2342    Sherwood Gambler, MD 01/15/19 1625

## 2019-01-12 LAB — BLOOD CULTURE ID PANEL (REFLEXED)
Acinetobacter baumannii: NOT DETECTED
Candida albicans: NOT DETECTED
Candida glabrata: NOT DETECTED
Candida krusei: NOT DETECTED
Candida parapsilosis: NOT DETECTED
Candida tropicalis: NOT DETECTED
Carbapenem resistance: NOT DETECTED
Enterobacter cloacae complex: NOT DETECTED
Enterobacteriaceae species: DETECTED — AB
Enterococcus species: NOT DETECTED
Escherichia coli: NOT DETECTED
Haemophilus influenzae: NOT DETECTED
KLEBSIELLA OXYTOCA: DETECTED — AB
Klebsiella pneumoniae: NOT DETECTED
Listeria monocytogenes: NOT DETECTED
Methicillin resistance: NOT DETECTED
Neisseria meningitidis: NOT DETECTED
Proteus species: NOT DETECTED
Pseudomonas aeruginosa: DETECTED — AB
SERRATIA MARCESCENS: NOT DETECTED
Staphylococcus aureus (BCID): NOT DETECTED
Staphylococcus species: DETECTED — AB
Streptococcus agalactiae: NOT DETECTED
Streptococcus pneumoniae: NOT DETECTED
Streptococcus pyogenes: NOT DETECTED
Streptococcus species: NOT DETECTED

## 2019-01-12 LAB — RESPIRATORY PANEL BY PCR

## 2019-01-12 LAB — CBC
HCT: 35.6 % — ABNORMAL LOW (ref 36.0–46.0)
HEMOGLOBIN: 11.5 g/dL — AB (ref 12.0–15.0)
MCH: 28.5 pg (ref 26.0–34.0)
MCHC: 32.3 g/dL (ref 30.0–36.0)
MCV: 88.1 fL (ref 80.0–100.0)
PLATELETS: 216 10*3/uL (ref 150–400)
RBC: 4.04 MIL/uL (ref 3.87–5.11)
RDW: 14.1 % (ref 11.5–15.5)
WBC: 23 10*3/uL — ABNORMAL HIGH (ref 4.0–10.5)
nRBC: 0 % (ref 0.0–0.2)

## 2019-01-12 LAB — COMPREHENSIVE METABOLIC PANEL
ALT: 9 U/L (ref 0–44)
ANION GAP: 7 (ref 5–15)
AST: 28 U/L (ref 15–41)
Albumin: 2.6 g/dL — ABNORMAL LOW (ref 3.5–5.0)
Alkaline Phosphatase: 77 U/L (ref 38–126)
BUN: 19 mg/dL (ref 8–23)
CO2: 24 mmol/L (ref 22–32)
Calcium: 8.1 mg/dL — ABNORMAL LOW (ref 8.9–10.3)
Chloride: 105 mmol/L (ref 98–111)
Creatinine, Ser: 0.85 mg/dL (ref 0.44–1.00)
GFR calc Af Amer: >60 mL/min (ref >60)
GFR calc non Af Amer: >60 mL/min (ref >60)
GLUCOSE: 111 mg/dL — AB (ref 70–99)
Potassium: 3.5 mmol/L (ref 3.5–5.1)
Sodium: 136 mmol/L (ref 135–145)
Total Bilirubin: 1 mg/dL (ref 0.3–1.2)
Total Protein: 5.4 g/dL — ABNORMAL LOW (ref 6.5–8.1)

## 2019-01-12 LAB — STREP PNEUMONIAE URINARY ANTIGEN: Strep Pneumo Urinary Antigen: NEGATIVE

## 2019-01-12 LAB — PHOSPHORUS: Phosphorus: 3.2 mg/dL (ref 2.5–4.6)

## 2019-01-12 LAB — MAGNESIUM: Magnesium: 1.7 mg/dL (ref 1.7–2.4)

## 2019-01-12 LAB — TSH: TSH: 1.084 u[IU]/mL (ref 0.350–4.500)

## 2019-01-12 LAB — PROCALCITONIN: Procalcitonin: 59.3 ng/mL

## 2019-01-12 MED ORDER — VALACYCLOVIR HCL 500 MG PO TABS
500.0000 mg | ORAL_TABLET | Freq: Every day | ORAL | Status: DC
  Administered 2019-01-12 – 2019-01-16 (×6): 500 mg via ORAL
  Filled 2019-01-12 (×6): qty 1

## 2019-01-12 MED ORDER — IPRATROPIUM-ALBUTEROL 0.5-2.5 (3) MG/3ML IN SOLN
3.0000 mL | Freq: Three times a day (TID) | RESPIRATORY_TRACT | Status: DC
  Administered 2019-01-12 – 2019-01-13 (×5): 3 mL via RESPIRATORY_TRACT
  Filled 2019-01-12 (×5): qty 3

## 2019-01-12 MED ORDER — SODIUM CHLORIDE 0.9 % IV SOLN
2.0000 g | Freq: Two times a day (BID) | INTRAVENOUS | Status: DC
  Administered 2019-01-12 – 2019-01-16 (×9): 2 g via INTRAVENOUS
  Filled 2019-01-12 (×11): qty 2

## 2019-01-12 MED ORDER — IPRATROPIUM-ALBUTEROL 0.5-2.5 (3) MG/3ML IN SOLN: 3.0000 mL | Freq: Four times a day (QID) | RESPIRATORY_TRACT | Status: DC

## 2019-01-12 NOTE — Care Management Note (Signed)
Case Management Note  Patient Details  Name: ZEA KOSTKA MRN: [id number] Date of Birth: Sep 03, 1930  Subjective/Objective:                    Action/Plan:  Spoke to patient and daughter Tani Virgo at bedside. Patient from home with daughter. PT recommendations SNF, however, daughter Baxter Flattery wants to take her mother home at discharge. Patient already has a cane and walker at home. Would like a 3 in 1 . Discussed oxygen, patient currently does not have home oxygen. Will continue to follow.   Provided Medicare.gov list of home health agencies. Patient had an appointment today with admissions nurse from [facility], they would like to continue with [place]. Drew with [facility] aware.   Will need home health orders and face to face.    Expected Discharge Date:                  Expected Discharge Plan:  [place]  In-House Referral:     Discharge planning Services  CM Consult  Post Acute Care Choice:  Durable Medical Equipment, Home Health Choice offered to:  Patient, Adult Children  DME Arranged:  3-N-1 DME Agency:     HH Arranged:    [place]:  [place]  Status of Service:  In process, will continue to follow  If discussed at Long Length of Stay Meetings, dates discussed:    Additional Comments:  Marilu Favre, RN 01/12/2019, 3:54 PM

## 2019-01-12 NOTE — Progress Notes (Signed)
PHARMACY - PHYSICIAN COMMUNICATION CRITICAL VALUE ALERT - BLOOD CULTURE IDENTIFICATION (BCID)  Amanda Hinton is an 85 y.o. female who presented to [facility] on 01/11/2019 with a chief complaint of AMS  Assessment:  85 yo F presents with AMS. Found to be septic. Blood cx shows staph species, Kleb oxytoca, and pseudomonas. Would suspect staph species is contaminant but will technically be covered by cefepime  Name of physician (or Provider) Contacted: M. Mikhail, MD  Current antibiotics: Cefepime, Vancomycin, and Flagyl  Changes to prescribed antibiotics recommended:  Stop vancomycin and Flagyl  Change cefepime to 2g IV Q12h for more aggressive dosing F/U susceptibilities and LOT  Results for orders placed or performed during the hospital encounter of 01/11/19  Blood Culture ID Panel (Reflexed) (Collected: 01/11/2019  2:10 PM)  Result Value Ref Range   Enterococcus species NOT DETECTED NOT DETECTED   Listeria monocytogenes NOT DETECTED NOT DETECTED   Staphylococcus species DETECTED (A) NOT DETECTED   Staphylococcus aureus (BCID) NOT DETECTED NOT DETECTED   Methicillin resistance NOT DETECTED NOT DETECTED   Streptococcus species NOT DETECTED NOT DETECTED   Streptococcus agalactiae NOT DETECTED NOT DETECTED   Streptococcus pneumoniae NOT DETECTED NOT DETECTED   Streptococcus pyogenes NOT DETECTED NOT DETECTED   Acinetobacter baumannii NOT DETECTED NOT DETECTED   Enterobacteriaceae species DETECTED (A) NOT DETECTED   Enterobacter cloacae complex NOT DETECTED NOT DETECTED   Escherichia coli NOT DETECTED NOT DETECTED   Klebsiella oxytoca DETECTED (A) NOT DETECTED   Klebsiella pneumoniae NOT DETECTED NOT DETECTED   Proteus species NOT DETECTED NOT DETECTED   Serratia marcescens NOT DETECTED NOT DETECTED   Carbapenem resistance NOT DETECTED NOT DETECTED   Haemophilus influenzae NOT DETECTED NOT DETECTED   Neisseria meningitidis NOT DETECTED NOT DETECTED   Pseudomonas aeruginosa  DETECTED (A) NOT DETECTED   Candida albicans NOT DETECTED NOT DETECTED   Candida glabrata NOT DETECTED NOT DETECTED   Candida krusei NOT DETECTED NOT DETECTED   Candida parapsilosis NOT DETECTED NOT DETECTED   Candida tropicalis NOT DETECTED NOT DETECTED    [name] 01/12/2019  10:21 AM

## 2019-01-12 NOTE — Evaluation (Signed)
Occupational Therapy Evaluation Patient Details Name: Amanda Hinton MRN: [id number] DOB: Apr 25, 1930 Today's Date: 01/12/2019    History of Present Illness 85 y.o. female with medical history significant of dementia, hypertension, hyperlipidemia, COPD, osteoporosis and AAA. Presented with confusion since this morning incontinent of urine. Dx SIRS (systemic inflammatory response syndrome) with likely source CAP.   Clinical Impression   This 85 yo female admitted with above presents to acute OT with decreased balance, decreased vision left eye (pre-existing), and decreased safety awareness all affecting her safety and independence with basic ADLs. She will continue to benefit from acute OT with follow up at SNF.    Follow Up Recommendations  SNF;Supervision/Assistance - 24 hour    Equipment Recommendations  None recommended by OT       Precautions / Restrictions Precautions Precautions: Fall Precaution Comments: reports fall a couple months ago, tailbone fx per dtr Restrictions Weight Bearing Restrictions: No      Mobility Bed Mobility               General bed mobility comments: OOB in recliner on entry  Transfers Overall transfer level: Needs assistance Equipment used: None Transfers: Sit to/from Stand Sit to Stand: Min assist         General transfer comment: tendency for posterior lean when first stood up from recliner    Balance Overall balance assessment: Needs assistance Sitting-balance support: No upper extremity supported;Feet supported Sitting balance-Leahy Scale: Fair     Standing balance support: No upper extremity supported;During functional activity Standing balance-Leahy Scale: Poor Standing balance comment: needed external support with standing at sink and no support from UEs                           ADL either performed or assessed with clinical judgement   ADL Overall ADL's : Needs assistance/impaired Eating/Feeding:  Independent;Sitting   Grooming: Wash/dry face;Oral care;Min guard;Standing   Upper Body Bathing: Set up;Supervision/ safety;Sitting   Lower Body Bathing: Minimal assistance;Sit to/from stand   Upper Body Dressing : Set up;Supervision/safety;Sitting   Lower Body Dressing: Minimal assistance;Sit to/from stand   Toilet Transfer: Minimal assistance;Ambulation   Toileting- Clothing Manipulation and Hygiene: Minimal assistance;Sit to/from stand               Vision Baseline Vision/History: Legally blind(left eye) Patient Visual Report: No change from baseline              Pertinent Vitals/Pain Pain Assessment: No/denies pain     Hand Dominance Right   Extremity/Trunk Assessment Upper Extremity Assessment Upper Extremity Assessment: Overall WFL for tasks assessed     Communication Communication Communication: No difficulties   Cognition Arousal/Alertness: Awake/alert Behavior During Therapy: Anxious Overall Cognitive Status: History of cognitive impairments - at baseline                                              Home Living Family/patient expects to be discharged to:: Skilled nursing facility Living Arrangements: Alone Available Help at Discharge: Family;Available PRN/intermittently Type of Home: House Home Access: Stairs to enter [employer] of Steps: 3 Entrance Stairs-Rails: Can reach both;Right;Left Home Layout: One level     Bathroom Shower/Tub: Walk-in shower(usually takes bird baths)   Bathroom Toilet: Standard Bathroom Accessibility: Yes   Home Equipment: Environmental consultant - 2 wheels;Cane - single point  Prior Functioning/Environment Level of Independence: Independent with assistive device(s);Needs assistance  Gait / Transfers Assistance Needed: limited community ambulation with cane; in house she "furniture walks" per pt's dtr ADL's / Homemaking Assistance Needed: takes bird baths, limited cooking, daughter lives  around corner and assists with shopping and they go out to eat for supper every night            OT Problem List: Impaired balance (sitting and/or standing);Decreased cognition;Decreased safety awareness         OT Goals(Current goals can be found in the care plan section) Acute Rehab OT Goals Patient Stated Goal: to go home  OT Frequency:                AM-PAC OT "6 Clicks" Daily Activity     Outcome Measure Help from another person eating meals?: None Help from another person taking care of personal grooming?: A Little Help from another person toileting, which includes using toliet, bedpan, or urinal?: A Little Help from another person bathing (including washing, rinsing, drying)?: A Little Help from another person to put on and taking off regular upper body clothing?: A Little Help from another person to put on and taking off regular lower body clothing?: A Little 6 Click Score: 19   End of Session Equipment Utilized During Treatment: Gait belt;Oxygen(3 liters)  Activity Tolerance: Patient tolerated treatment well(did note some DOE with standing for grooming and walking to door and back to recliner; sats remained in mid to high 90's on 3 liters) Patient left: in chair;with call bell/phone within reach;with chair alarm set;with family/visitor present  OT Visit Diagnosis: Unsteadiness on feet (R26.81);Other abnormalities of gait and mobility (R26.89);History of falling (Z91.81);Muscle weakness (generalized) (M62.81);Other symptoms and signs involving cognitive function                Time: 3545-6256 OT Time Calculation (min): 32 min Charges:  OT General Charges $OT Visit: 1 Visit OT Evaluation $OT Eval Moderate Complexity: 1 Mod OT Treatments $Self Care/Home Management : 8-22 mins  [place] [address], OTR/L Acute [employer] Pager [phone] Office [phone]     Almon Register 01/12/2019, 12:58 PM

## 2019-01-12 NOTE — Plan of Care (Signed)
  Problem: Clinical Measurements: Goal: Diagnostic test results will improve Outcome: Progressing Goal: Cardiovascular complication will be avoided Outcome: Progressing   

## 2019-01-12 NOTE — Progress Notes (Signed)
SATURATION QUALIFICATIONS: (This note is used to comply with regulatory documentation for home oxygen)  Patient Saturations on Room Air at Rest 78%  Patient Saturations on Room Air while Ambulating 78%  Patient Saturations on3 Liters of oxygen while Ambulating 85%  Please briefly explain why patient needs home oxygen:

## 2019-01-12 NOTE — Progress Notes (Signed)
Pharmacy Antibiotic Note  Amanda Hinton is a 85 y.o. female admitted on 01/11/2019 with sepsis.  Pharmacy has been consulted for cefepime dosing.  Now with pseudomonas, kleb oxytoca, and staph species in blood cx.  Plan: Stop vancomycin and Flagyl  Change cefepime to 2g IV Q12h for more aggressive dosing F/U susceptibilities and LOT  Height: 5\' 6"  (167.6 cm) Weight: 130 lb (59 kg) IBW/kg (Calculated) : 59.3  Temp (24hrs), Avg:98.8 F (37.1 C), Min:97.4 F (36.3 C), Max:103.3 F (39.6 C)  Recent Labs  Lab 01/11/19 1350 01/11/19 1400 01/12/19 0334  WBC 19.8*  --  23.0*  CREATININE 0.99  --  0.85  LATICACIDVEN  --  1.3  --     Estimated Creatinine Clearance: 43.4 mL/min (by C-G formula based on SCr of 0.85 mg/dL).    Allergies  Allergen Reactions  . Avelox [Moxifloxacin Hcl In Nacl] Other (See Comments)    Prolonged QT wave  . Prednisone Other (See Comments)    REACTION: Disoriented    Thank you for allowing pharmacy to be a part of this patient's care.  Reginia Naas 01/12/2019 10:29 AM

## 2019-01-12 NOTE — Progress Notes (Addendum)
PROGRESS NOTE    Amanda Hinton  WUJ:[id number] DOB: 11-12-1930 DOA: 01/11/2019 PCP: Lawerance Cruel, MD   Brief Narrative:  85 year old female with history of dementia, hypertension, hyperlipidemia, COPD, AAA, osteoporosis presented to the emergency department with confusion and was incontinent of urine.  Patient lives at home by herself.  Upon arrival, patient was found to be febrile with a temperature of 103.3 F, tachypneic.  Admitted for sepsis secondary to pneumonia and possible urinary tract infection.  Currently on IV antibiotics. Assessment & Plan   Severe sepsis secondary to community-acquired pneumonia, possibly urinary tract infection -Patient presented to the emergency department with a temperature of 103.3 F as well as tachypnea and leukocytosis, AMS, hypoxia -Chest x-ray reviewed, showing possible subtle right upper lobe infiltrate -CT abdomen pelvis also noted peribronchial thickening of the left lower lobe may be representing chronic changes but cannot exclude a component of bronchitis. -UA showed positive nitrites, small leukocytes, few bacteria, 11-20 WBCs -Blood and urine cultures currently pending -Placed on vancomycin, Flagyl and cefepime  Acute respiratory failure with hypoxia/COPD -Admission, patient was noted to have oxygen saturations of 89%, and was placed on supplemental oxygen -No wheezing on examination -Suspect secondary to the above -Chest x-ray and CT results as above -Continue nebulizer treatments, and supplemental oxygen to maintain saturations above 95%  Acute metabolic encephalopathy with underlying dementia -Patient's baseline unknown -Currently alert and oriented to self -Suspect confusion secondary to sepsis and underlying infection -Continue to monitor, suspect some sundowning will occur -Continue Aricept  AAA -CT abdomen pelvis showed significant sigmoid diverticulosis with muscular hypertrophy, however no findings of diverticulitis.   Patient noted to have a AAA 3.2 cm versus prior 3 cm.  Recommended follow-up by ultrasound in 3 years   Fractures- pubic body, sacral  -Found incidentally on CT -CT abdomen pelvis showed remote left pubic body fracture with incomplete healing.  Remote sacral fractures. -PT/OT consulted  DVT Prophylaxis Lovenox  Code Status: Full  Family Communication: None at bedside  Disposition Plan: Admitted.  Pending blood and urine cultures.  Dispo TBD  Consultants None  Procedures  None  Antibiotics   Anti-infectives (From admission, onward)   Start     Dose/Rate Route Frequency Ordered Stop   01/12/19 1700  vancomycin (VANCOCIN) IVPB 750 mg/150 ml premix     750 mg 150 mL/hr over 60 Minutes Intravenous Every 24 hours 01/11/19 1524     01/12/19 1600  ceFEPIme (MAXIPIME) 2 g in sodium chloride 0.9 % 100 mL IVPB     2 g 200 mL/hr over 30 Minutes Intravenous Every 24 hours 01/11/19 1524     01/12/19 1000  valACYclovir (VALTREX) tablet 500 mg  Status:  Discontinued     500 mg Oral Daily 01/11/19 2259 01/12/19 0024   01/12/19 0030  valACYclovir (VALTREX) tablet 500 mg     500 mg Oral Daily 01/12/19 0024     01/11/19 1515  ceFEPIme (MAXIPIME) 2 g in sodium chloride 0.9 % 100 mL IVPB     2 g 200 mL/hr over 30 Minutes Intravenous  Once 01/11/19 1514 01/11/19 1856   01/11/19 1515  metroNIDAZOLE (FLAGYL) IVPB 500 mg     500 mg 100 mL/hr over 60 Minutes Intravenous Every 8 hours 01/11/19 1514     01/11/19 1515  vancomycin (VANCOCIN) IVPB 1000 mg/200 mL premix     1,000 mg 200 mL/hr over 60 Minutes Intravenous  Once 01/11/19 1514 01/11/19 1856      Subjective:  Amanda Hinton seen and examined today.  She with dementia.  Feels that she is at a nursing facility.  Has no complaints this morning.  Objective:   Vitals:   01/12/19 0128 01/12/19 0300 01/12/19 0517 01/12/19 0733  BP:  (!) 146/55 (!) 143/73   Pulse:  98 100   Resp:      Temp:  98.2 F (36.8 C) 98.3 F (36.8 C)     TempSrc:  Oral Oral   SpO2: 95% 94% 96% 93%  Weight:      Height:        Intake/Output Summary (Last 24 hours) at 01/12/2019 0917 Last data filed at 01/12/2019 0915 Gross per 24 hour  Intake 1293.18 ml  Output 150 ml  Net 1143.18 ml   Filed Weights   01/11/19 1338  Weight: 59 kg   Exam  General: Well developed, elderly, NAD  HEENT: NCAT,  mucous membranes moist.   Neck: Supple  Cardiovascular: S1 S2 auscultated, RRR  Respiratory: Diminished breath sounds however clear  Abdomen: Soft, nontender, nondistended, + bowel sounds  Extremities: warm dry without cyanosis clubbing or edema  Neuro: AAOx1 (self only), otherwise nonfocal  Psych: Pleasantly Confused   Data Reviewed: I have personally reviewed following labs and imaging studies  CBC: Recent Labs  Lab 01/11/19 1350 01/11/19 2013 01/12/19 0334  WBC 19.8*  --  23.0*  NEUTROABS 18.6*  --   --   HGB 11.8* 10.2* 11.5*  HCT 37.6 30.0* 35.6*  MCV 88.9  --  88.1  PLT 211  --  174   Basic Metabolic Panel: Recent Labs  Lab 01/11/19 1350 01/11/19 2013 01/12/19 0334  NA 138 135 136  K 4.1 4.2 3.5  CL 104  --  105  CO2 26  --  24  GLUCOSE 98  --  111*  BUN 18  --  19  CREATININE 0.99  --  0.85  CALCIUM 8.6*  --  8.1*  MG  --   --  1.7  PHOS  --   --  3.2   GFR: Estimated Creatinine Clearance: 43.4 mL/min (by C-G formula based on SCr of 0.85 mg/dL). Liver Function Tests: Recent Labs  Lab 01/11/19 1350 01/12/19 0334  AST 31 28  ALT 9 9  ALKPHOS 76 77  BILITOT 1.1 1.0  PROT 6.3* 5.4*  ALBUMIN 3.0* 2.6*   No results for input(s): LIPASE, AMYLASE in the last 168 hours. No results for input(s): AMMONIA in the last 168 hours. Coagulation Profile: No results for input(s): INR, PROTIME in the last 168 hours. Cardiac Enzymes: No results for input(s): CKTOTAL, CKMB, CKMBINDEX, TROPONINI in the last 168 hours. BNP (last 3 results) No results for input(s): PROBNP in the last 8760 hours. HbA1C: No  results for input(s): HGBA1C in the last 72 hours. CBG: No results for input(s): GLUCAP in the last 168 hours. Lipid Profile: No results for input(s): CHOL, HDL, LDLCALC, TRIG, CHOLHDL, LDLDIRECT in the last 72 hours. Thyroid Function Tests: Recent Labs    01/12/19 0334  TSH 1.084   Anemia Panel: No results for input(s): VITAMINB12, FOLATE, FERRITIN, TIBC, IRON, RETICCTPCT in the last 72 hours. Urine analysis:    Component Value Date/Time   COLORURINE YELLOW 01/11/2019 2049   APPEARANCEUR HAZY (A) 01/11/2019 2049   LABSPEC >1.046 (H) 01/11/2019 2049   PHURINE 5.0 01/11/2019 2049   GLUCOSEU NEGATIVE 01/11/2019 2049   HGBUR MODERATE (A) 01/11/2019 2049   BILIRUBINUR NEGATIVE 01/11/2019 2049   KETONESUR 20 (  A) 01/11/2019 2049   PROTEINUR NEGATIVE 01/11/2019 2049   NITRITE POSITIVE (A) 01/11/2019 2049   LEUKOCYTESUR SMALL (A) 01/11/2019 2049   Sepsis Labs: @LABRCNTIP (procalcitonin:4,lacticidven:4)  ) Recent Results (from the past 240 hour(s))  Culture, blood (routine x 2)     Status: None (Preliminary result)   Collection Time: 01/11/19  2:10 PM  Result Value Ref Range Status   Specimen Description BLOOD LEFT HAND  Final   Special Requests   Final    BOTTLES DRAWN AEROBIC AND ANAEROBIC Blood Culture adequate volume   Culture  Setup Time   Final    GRAM POSITIVE COCCI GRAM NEGATIVE RODS ANAEROBIC BOTTLE ONLY Organism ID to follow Performed at [facility], [place] [address] [place], [place] [address]    Culture PENDING  Incomplete   Report Status PENDING  Incomplete      Radiology Studies: Dg Chest 2 View  Result Date: 01/11/2019 CLINICAL DATA:  Altered mental status, incontinence and possible pneumonia. EXAM: CHEST - 2 VIEW COMPARISON:  11/07/2015 FINDINGS: The heart size is at the upper limits of normal. There may be a subtle right upper lobe infiltrate. Chronic lung disease present with potential mild interstitial edema. No pleural effusions or evidence of  pneumothorax. IMPRESSION: Possible subtle right upper lobe infiltrate. Chronic lung disease with potential mild interstitial edema. Electronically Signed   By: Aletta Edouard M.D.   On: 01/11/2019 16:14   Ct Abdomen Pelvis W Contrast  Result Date: 01/11/2019 CLINICAL DATA:  85 year old female with confusion. Fever. Known aneurysm which is stable. Prior appendectomy. Initial encounter. EXAM: CT ABDOMEN AND PELVIS WITH CONTRAST TECHNIQUE: Multidetector CT imaging of the abdomen and pelvis was performed using the standard protocol following bolus administration of intravenous contrast. CONTRAST:  149mL OMNIPAQUE IOHEXOL 300 MG/ML  SOLN COMPARISON:  Pelvic CT 07/07/2018. 06/16/2017 abdominal aortic ultrasound. FINDINGS: Lower chest: Basilar parenchymal changes suggestive of scarring/atelectasis. Peribronchial thickening left lower lobe may represent chronic changes although can not exclude component of bronchitis. Left base small calcified granuloma. Cardiomegaly. Mitral valve calcification. Coronary artery calcification. Hepatobiliary: Motion degraded exam. No worrisome hepatic lesion. No calcified gallstones. Limited for evaluating for gallbladder inflammation secondary to motion artifact. Pancreas: Motion degraded exam. No worrisome pancreatic mass or inflammation noted. Spleen: Motion degraded exam causes artifact near the hilum. Adrenals/Urinary Tract: Motion degraded exam. No obstructing stone or hydronephrosis. No worrisome renal mass. Right lower pole 7 mm nonobstructing stone. No worrisome adrenal lesion. Noncontrast filled views of the urinary bladder unremarkable. Stomach/Bowel: Significant sigmoid diverticula and muscular hypertrophy. Slightly engorged vessels within the sigmoid mesentery but without discrete evidence of diverticulitis. Other portions of bowel are limited by motion degradation however, no extraluminal bowel inflammatory process noted. Moderate-size hiatal hernia. Partially fluid-filled  normal size small bowel loops. Vascular/Lymphatic: Prominent atherosclerotic changes abdominal aorta and aortic branch vessels. Infrarenal abdominal aortic aneurysm measuring up to 3.2 cm versus 3 cm on prior ultrasound. Prominent narrowing origin of the celiac artery, superior mesenteric artery, inferior mesenteric artery and renal arteries. Significant narrowing iliac arteries. Scattered normal size lymph nodes. Reproductive: No worrisome uterine or adnexal mass. Other: No free intraperitoneal air or bowel containing hernia. Musculoskeletal: Remote left pubic body fracture with incomplete healing. Remote sacral fractures. Prominent scoliosis lower thoracic and lumbar spine convex left with superimposed degenerative changes. IMPRESSION: 1. Exam is motion degraded which limits evaluation for detection of an inflammatory process (particularly at the level of the gallbladder and upper abdomen). Taking this limitation into account, no discrete inflammatory  process is noted. 2. Significant sigmoid diverticulosis with muscular hypertrophy. Slightly engorged vessels within the sigmoid mesentery but without discrete findings of diverticulitis. 3. Moderate size hiatal hernia. 4. Peribronchial thickening left lower lobe may represent chronic changes although can not exclude component of bronchitis. Basilar atelectasis/scarring. 5. Cardiomegaly.  Coronary artery calcification. 6. Aortic Atherosclerosis (ICD10-I70.0). Abdominal aortic aneurysm measures 3.2 cm versus prior 3 cm. Recommend followup by ultrasound in 3 years. This recommendation follows ACR consensus guidelines: White Paper of the ACR Incidental Findings Committee II on Vascular Findings. J Am Coll Radiol 2013; 39:767-341 7. Prominent narrowing origin of the celiac artery, superior mesenteric artery, inferior mesenteric artery and renal arteries. Significant narrowing iliac arteries. 8. Remote left pubic body fracture with incomplete healing. Remote sacral  fractures. 9. Prominent scoliosis lumbar spine convex left with superimposed degenerative changes. Electronically Signed   By: Genia Del M.D.   On: 01/11/2019 17:31     Scheduled Meds: . aspirin  81 mg Oral Daily  . cycloSPORINE  1 drop Both Eyes BID  . donepezil  5 mg Oral Daily  . enoxaparin (LOVENOX) injection  40 mg Subcutaneous Q24H  . guaiFENesin  600 mg Oral BID  . ipratropium-albuterol  3 mL Nebulization TID  . valACYclovir  500 mg Oral Daily   Continuous Infusions: . ceFEPime (MAXIPIME) IV    . metronidazole 500 mg (01/12/19 0159)  . vancomycin       LOS: 1 day   Time Spent in minutes   30 minutes  Amanda Hinton D.O. on 01/12/2019 at 9:17 AM  Between 7am to 7pm - Please see pager noted on [email or web address]  After 7pm go to [email or web address]  And look for the night coverage person covering for me after hours  [facility] Office  [phone]

## 2019-01-12 NOTE — Evaluation (Signed)
Physical Therapy Evaluation Patient Details Name: Amanda Hinton MRN: [id number] DOB: 1929-12-18 Today's Date: 01/12/2019   History of Present Illness  85 y.o. female with medical history significant of dementia, hypertension, hyperlipidemia, COPD, osteoporosis and AAA. Presented with confusion since this morning incontinent of urine. Dx SIRS (systemic inflammatory response syndrome) with likely source CAP.  Clinical Impression  PTA pt reports living alone in single story home with 3 steps to enter, she ambulates with a cane and is independent in ADLs. Pt reports daughter assists with shopping and other iADLs. Pt currently is limited in safe mobility by oxygen desaturation, as well as impulsivity of movement, decreased safety awareness and generalized weakness. Pt currently requires minA for transfers and ambulation of 20 feet with RW. PT currently recommending SNF level rehab at discharge given decreased supervision, and decreased safety awareness in the presence of generalized weakness and need for supplementary oxygen. PT will continue to follow acutely.     Follow Up Recommendations SNF    Equipment Recommendations  Other (comment)(to be determined at next venue)    Recommendations for Other Services       Precautions / Restrictions Precautions Precautions: Fall Precaution Comments: reports fall a couple months ago, pelvic fx Restrictions Weight Bearing Restrictions: No      Mobility  Bed Mobility               General bed mobility comments: OOB in recliner on entry  Transfers Overall transfer level: Needs assistance Equipment used: Rolling walker (2 wheeled) Transfers: Sit to/from Stand Sit to Stand: Min assist         General transfer comment: minA for power up and steadying from RW, vc for not pushing RW away but using it to steady in standing  Ambulation/Gait Ambulation/Gait assistance: Min assist Gait Distance (Feet): 20 Feet Assistive device: Rolling  walker (2 wheeled) Gait Pattern/deviations: Step-through pattern;Decreased step length - right;Decreased step length - left;Shuffle;Trunk flexed Gait velocity: slowed Gait velocity interpretation: <1.8 ft/sec, indicate of risk for recurrent falls General Gait Details: min A for steadying with RW, vc for management of RW around obstacles, and for proximity to RW        Balance Overall balance assessment: Needs assistance Sitting-balance support: Feet supported;No upper extremity supported Sitting balance-Leahy Scale: Fair     Standing balance support: Single extremity supported;During functional activity Standing balance-Leahy Scale: Poor Standing balance comment: requires at least single UE support for steadying                             Pertinent Vitals/Pain Pain Assessment: No/denies pain    Home Living Family/patient expects to be discharged to:: Private residence Living Arrangements: Alone Available Help at Discharge: Family;Available PRN/intermittently Type of Home: House Home Access: Stairs to enter Entrance Stairs-Rails: Can reach both;Right;Left Entrance Stairs-Number of Steps: 3 Home Layout: One level Home Equipment: Walker - 2 wheels;Cane - single point      Prior Function Level of Independence: Independent with assistive device(s);Needs assistance   Gait / Transfers Assistance Needed: limited community ambulation with cane  ADL's / Homemaking Assistance Needed: takes bird baths, limited cooking, daughter lives around corner and assists with shopping        Hand Dominance        Extremity/Trunk Assessment   Upper Extremity Assessment Upper Extremity Assessment: Generalized weakness    Lower Extremity Assessment Lower Extremity Assessment: Generalized weakness    Cervical / Trunk Assessment Cervical /  Trunk Assessment: Kyphotic  Communication   Communication: No difficulties  Cognition Arousal/Alertness: Awake/alert Behavior During  Therapy: Anxious;Impulsive Overall Cognitive Status: History of cognitive impairments - at baseline                                 General Comments: Pt was oriented and able to answer PLOF and home set up questions, but was slightly anxious in participating because her daughter was not there, and was worried her daughter would not find her if she left the room and is impulsive with her mobility       General Comments General comments (skin integrity, edema, etc.): Pt on 2L O2 on entry with SaO2 91%O2. Requires cuing for breathing through her nose and SaO2 increased to 96%O2.  See Ambulatory Oxygen Saturation note     Assessment/Plan    PT Assessment Patient needs continued PT services  PT Problem List Decreased knowledge of use of DME;Decreased safety awareness;Decreased cognition;Decreased balance;Decreased strength;Decreased activity tolerance;Cardiopulmonary status limiting activity       PT Treatment Interventions DME instruction;Gait training;Functional mobility training;Therapeutic activities;Therapeutic exercise;Balance training;Cognitive remediation;Patient/family education    PT Goals (Current goals can be found in the Care Plan section)  Acute Rehab PT Goals Patient Stated Goal: feel better PT Goal Formulation: With patient Time For Goal Achievement: 01/26/19 Potential to Achieve Goals: Fair    Frequency Min 3X/week   Barriers to discharge Decreased caregiver support      Co-evaluation               AM-PAC PT "6 Clicks" Mobility  Outcome Measure Help needed turning from your back to your side while in a flat bed without using bedrails?: A Little Help needed moving from lying on your back to sitting on the side of a flat bed without using bedrails?: A Little Help needed moving to and from a bed to a chair (including a wheelchair)?: A Little Help needed standing up from a chair using your arms (e.g., wheelchair or bedside chair)?: A Little Help  needed to walk in hospital room?: A Little Help needed climbing 3-5 steps with a railing? : A Lot 6 Click Score: 17    End of Session Equipment Utilized During Treatment: Gait belt;Oxygen Activity Tolerance: Patient limited by fatigue Patient left: in chair;with call bell/phone within reach;with chair alarm set Nurse Communication: Mobility status PT Visit Diagnosis: Unsteadiness on feet (R26.81);Other abnormalities of gait and mobility (R26.89);Muscle weakness (generalized) (M62.81);History of falling (Z91.81);Difficulty in walking, not elsewhere classified (R26.2)    Time: 1017-1040 PT Time Calculation (min) (ACUTE ONLY): 23 min   Charges:   PT Evaluation $PT Eval Moderate Complexity: 1 Mod PT Treatments $Gait Training: 8-22 mins        Yitzchak Kothari B. Migdalia Dk PT, DPT Acute Rehabilitation Services Pager [phone] Office [phone]   [place] 01/12/2019, 11:30 AM

## 2019-01-12 NOTE — Progress Notes (Signed)
SATURATION QUALIFICATIONS: (This note is used to comply with regulatory documentation for home oxygen)  Patient Saturations on Room Air at Rest = 86%O2  Patient Saturations on 2L of oxygen at Rest  = 91% O2  Patient Saturations on 2L of oxygen while Ambulating =83% O2  Patient Saturations on 3 Liters of oxygen while Ambulating = 91% O2  Please briefly explain why patient needs home oxygen: Pt currently requires 3L of O2 during ambulation to keep O2 saturation above 90%O2.   Caige Almeda B. Migdalia Dk PT, DPT Acute Rehabilitation Services Pager [phone] Office [phone]

## 2019-01-12 NOTE — Clinical Social Work Note (Signed)
Clinical Social Work Assessment  Patient Details  Name: ALAIRA Hinton MRN: [id number] Date of Birth: 1930-08-12  Date of referral:  01/12/19               Reason for consult:  Discharge Planning, Facility Placement                Permission sought to share information with:  Facility Sport and exercise psychologist, Family Supports Permission granted to share information::  Yes, Verbal Permission Granted(pt only oriented to self)  Name::     Wilmon Pali  Agency::  SNF[facility]  Relationship::  daughter  Contact Information:  [phone]  Housing/Transportation Living arrangements for the past 2 months:  Apartment Source of Information:  Adult Children Patient Interpreter Needed:  None Criminal Activity/Legal Involvement Pertinent to Current Situation/Hospitalization:  No - Comment as needed Significant Relationships:  Adult Children Lives with:  Self Do you feel safe going back to the place where you live?  Yes Need for family participation in patient care:  Yes (Comment)  Care giving concerns:  Pt from home alone. Her daughter lives 10 mins away and works around the corner. Pt daughter states she was set up with home health but then admitted to hospital before she had her first visits.    Social Worker assessment / plan:  CSW met with pt at bedside, pt daughter also present. Pt only oriented to self, but still was able to consent to her daughter being present and conversed appropriately. CSW introduced self, role, and reason for visit. Pt from home alone, she lives in a small apartment and gets around relatively independently with assistance from a cane or walker and holding on to furniture. Pt daughter works right around the corner from pt and checks in multiple times throughout the day.   CSW discussed recommendations and pt daughter states that she and the pt would prefer pt to return home with home health. They had set up [facility] to come to the home prior to pt admitting to  hospital. They understand recommendations but feel better with pt being home given pt memory loss. Pt daughter states she thinks pt will do better with returning home instead of going from hospital to facility.  CSW discussed that I would work with [facility] to get equipment and [place] arranged prior to discharge.  Pt and pt daughter in agreement with plan.  Employment status:  Retired Forensic scientist:  [employer] PT Recommendations:  [place], [place] / Referral to community resources:  [place]  Patient/Family's Response to care:  Pt and pt daughter amenable to speaking with CSW. They prefer HH to SNF placement.   Patient/Family's Understanding of and Emotional Response to Diagnosis, Current Treatment, and Prognosis:  Pt and pt daughter are understanding of diagnosis, current treatment and prognosis. Pt daughter feels comfortable with pt going home with home health knowing they are not 24/7 supports. Pt and pt daughter both pleasant and emotionally appropriate throughout assessment.  Emotional Assessment Appearance:  Appears stated age Attitude/Demeanor/Rapport:  Gracious Affect (typically observed):  Accepting, Adaptable, Appropriate Orientation:  Oriented to Self, Fluctuating Orientation (Suspected and/or reported Sundowners) Alcohol / Substance use:  Not Applicable Psych involvement (Current and /or in the community):  No (Comment)  Discharge Needs  Concerns to be addressed:  Care Coordination Readmission within the last 30 days:  No Current discharge risk:  Dependent with Mobility, Lives alone Barriers to Discharge:  Continued Medical Work up   [employer],  LCSWA 01/12/2019, 3:43 PM

## 2019-01-13 DIAGNOSIS — R6520 Severe sepsis without septic shock: Secondary | ICD-10-CM

## 2019-01-13 DIAGNOSIS — A4152 Sepsis due to Pseudomonas: Principal | ICD-10-CM

## 2019-01-13 DIAGNOSIS — I4891 Unspecified atrial fibrillation: Secondary | ICD-10-CM

## 2019-01-13 DIAGNOSIS — R7881 Bacteremia: Secondary | ICD-10-CM

## 2019-01-13 LAB — CBC
HEMATOCRIT: 34.3 % — AB (ref 36.0–46.0)
Hemoglobin: 11.1 g/dL — ABNORMAL LOW (ref 12.0–15.0)
MCH: 28 pg (ref 26.0–34.0)
MCHC: 32.4 g/dL (ref 30.0–36.0)
MCV: 86.4 fL (ref 80.0–100.0)
Platelets: 198 10*3/uL (ref 150–400)
RBC: 3.97 MIL/uL (ref 3.87–5.11)
RDW: 14 % (ref 11.5–15.5)
WBC: 18.8 10*3/uL — ABNORMAL HIGH (ref 4.0–10.5)
nRBC: 0 % (ref 0.0–0.2)

## 2019-01-13 LAB — BASIC METABOLIC PANEL
Anion gap: 10 (ref 5–15)
BUN: 23 mg/dL (ref 8–23)
CO2: 20 mmol/L — ABNORMAL LOW (ref 22–32)
Calcium: 8.3 mg/dL — ABNORMAL LOW (ref 8.9–10.3)
Chloride: 105 mmol/L (ref 98–111)
Creatinine, Ser: 0.83 mg/dL (ref 0.44–1.00)
GFR calc Af Amer: >60 mL/min (ref >60)
GFR calc non Af Amer: >60 mL/min (ref >60)
GLUCOSE: 110 mg/dL — AB (ref 70–99)
Potassium: 3.6 mmol/L (ref 3.5–5.1)
Sodium: 135 mmol/L (ref 135–145)

## 2019-01-13 MED ORDER — ACETAMINOPHEN 500 MG PO TABS
1000.0000 mg | ORAL_TABLET | Freq: Every day | ORAL | Status: DC
  Administered 2019-01-13 – 2019-01-14 (×2): 1000 mg via ORAL
  Filled 2019-01-13 (×4): qty 2

## 2019-01-13 MED ORDER — METOPROLOL TARTRATE 5 MG/5ML IV SOLN
INTRAVENOUS | Status: AC
  Filled 2019-01-13: qty 5

## 2019-01-13 MED ORDER — METOPROLOL TARTRATE 5 MG/5ML IV SOLN: 5.0000 mg | Freq: Four times a day (QID) | INTRAVENOUS | Status: DC | PRN

## 2019-01-13 MED ORDER — METOPROLOL TARTRATE 50 MG PO TABS
50.0000 mg | ORAL_TABLET | Freq: Two times a day (BID) | ORAL | Status: DC
  Administered 2019-01-13: 50 mg via ORAL
  Filled 2019-01-13 (×2): qty 1

## 2019-01-13 MED ORDER — METOPROLOL TARTRATE 5 MG/5ML IV SOLN
5.0000 mg | Freq: Once | INTRAVENOUS | Status: AC
  Administered 2019-01-13: 5 mg via INTRAVENOUS
  Filled 2019-01-13: qty 5

## 2019-01-13 MED ORDER — METOPROLOL TARTRATE 5 MG/5ML IV SOLN
5.0000 mg | Freq: Four times a day (QID) | INTRAVENOUS | Status: DC | PRN
  Administered 2019-01-13 – 2019-01-14 (×2): 5 mg via INTRAVENOUS
  Filled 2019-01-13: qty 5

## 2019-01-13 MED ORDER — APIXABAN 2.5 MG PO TABS
2.5000 mg | ORAL_TABLET | Freq: Two times a day (BID) | ORAL | Status: DC
  Administered 2019-01-13 – 2019-01-16 (×6): 2.5 mg via ORAL
  Filled 2019-01-13 (×6): qty 1

## 2019-01-13 MED ORDER — IPRATROPIUM-ALBUTEROL 0.5-2.5 (3) MG/3ML IN SOLN: 3.0000 mL | RESPIRATORY_TRACT | Status: DC | PRN

## 2019-01-13 NOTE — Progress Notes (Signed)
PROGRESS NOTE    [name]  UXL:[id number] DOB: August 27, 1930 DOA: 01/11/2019 PCP: Lawerance Cruel, MD      Brief Narrative:  Amanda Hinton is a 85 y.o. F with dementia, community-dwelling, AAA 3.2cm in surveillance, and COPD no O2 who presented with fever, malaise.  In ER, CXR clear, UA suggestive of infection.  CT abdomen unremarkable. Cultures obtained and started on broad spectrum antibiotics for sepsis.  Blood cultures subsequently growing Klebsiella and Pseudomonas.      Assessment & Plan:  Sepsis Suspected source urine given GNR bacteremia, CXR without clear lobar pneumonia.  No further fever.  Delirium resolved.  Renal function normal.  Hypoxia improving today. -Continue Cefepime -Follow culture data   Delirium Dementia Resolved, patietn back to baseline dementia Delirium precautions:   -Lights and TV off, minimize interruptions at night  -Blinds open and lights on during day  -Glasses/hearing aid with patient  -Frequent reorientation  -PT/OT when able  -Avoid sedation medications/Be[place] list medications   COPD No active disease  Hypoxia Likely from sepsis -Wean O2 as able  New onset atrial fibrillation CHA2DS2-Vasc 4 from age, gender, PVD (family deny any history of HTN).  No prior stroke.  HASBLED 1, low risk. -Start Eliquis, 2.5 dose given age, weight -Metoprolol 5 mg IV now then start PO 50 BID -Monitor on telemetry -Stop aspirin, no compelling indication to conitnue -Check TSH, mag -Obtain Echo as outpatient  Mild anemia Possibly dilutional.  Normocytic.  No clinical bleeding. -Follow up CBC In 1 month after d/c   Other medications -Continue Valtrex     MDM and disposition: The below labs and imaging reports were reviewed and summarized above.  Medication management as above.  The patient was admitted with sepsis from UTI now with GNR bacteremia.  In addition, she has new onset atrial fibrillation today, ECG reviewed, shows Afib  with RVR, no ST changes.  Will start blood thinner.  Obtain TSH, mag; echo as outpatient.       DVT prophylaxis: N/A Code Status: FULL Family Communication: Daughter    Consultants:   None  Procedures:   None  Antimicrobials:  Anti-infectives (From admission, onward)   Start     Dose/Rate Route Frequency Ordered Stop   01/12/19 1700  vancomycin (VANCOCIN) IVPB 750 mg/150 ml premix  Status:  Discontinued     750 mg 150 mL/hr over 60 Minutes Intravenous Every 24 hours 01/11/19 1524 01/12/19 1028   01/12/19 1600  ceFEPIme (MAXIPIME) 2 g in sodium chloride 0.9 % 100 mL IVPB  Status:  Discontinued     2 g 200 mL/hr over 30 Minutes Intravenous Every 24 hours 01/11/19 1524 01/12/19 1027   01/12/19 1100  ceFEPIme (MAXIPIME) 2 g in sodium chloride 0.9 % 100 mL IVPB     2 g 200 mL/hr over 30 Minutes Intravenous Every 12 hours 01/12/19 1027     01/12/19 1000  valACYclovir (VALTREX) tablet 500 mg  Status:  Discontinued     500 mg Oral Daily 01/11/19 2259 01/12/19 0024   01/12/19 0030  valACYclovir (VALTREX) tablet 500 mg     500 mg Oral Daily 01/12/19 0024     01/11/19 1515  ceFEPIme (MAXIPIME) 2 g in sodium chloride 0.9 % 100 mL IVPB     2 g 200 mL/hr over 30 Minutes Intravenous  Once 01/11/19 1514 01/11/19 1856   01/11/19 1515  metroNIDAZOLE (FLAGYL) IVPB 500 mg  Status:  Discontinued     500 mg 100 mL/hr  over 60 Minutes Intravenous Every 8 hours 01/11/19 1514 01/12/19 1028   01/11/19 1515  vancomycin (VANCOCIN) IVPB 1000 mg/200 mL premix     1,000 mg 200 mL/hr over 60 Minutes Intravenous  Once 01/11/19 1514 01/11/19 1856           Subjective: Feels palpitations.  No confusion.  No chest pain, no dyspnea.  No vomiting, no further fever.  No abdominal pain.  Objective: Vitals:   01/13/19 0846 01/13/19 1331 01/13/19 1400 01/13/19 1446  BP:  (!) 141/83 (!) 144/79   Pulse:  (!) 135 (!) 121   Resp:  (!) 24 (!) 21   Temp:  98.6 F (37 C)    TempSrc:  Oral    SpO2: 95%  95% 94% 90%  Weight:      Height:        Intake/Output Summary (Last 24 hours) at 01/13/2019 1616 Last data filed at 01/13/2019 1500 Gross per 24 hour  Intake 220 ml  Output 450 ml  Net -230 ml   Filed Weights   01/11/19 1338  Weight: 59 kg    Examination: General appearance: elderly adult female, alert and in no acute distress.   HEENT: Pupils small but equal and reactive.  Anicteric, conjunctiva pink, lids and lashes normal. No nasal deformity, discharge, epistaxis.  Lips moist, dentures.  Normal OP, moist, hearing diminished.   Skin: Warm and dry.  no jaundice.  No suspicious rashes or lesions. Cardiac: tachycardic, irregular, nl S1-S2, no murmurs appreciated.  Capillary refill is brisk.  JVP not visible.  No LE edema.  Radia  pulses 2+ and symmetric. Respiratory: Normal respiratory rate and rhythm.  CTAB without rales or wheezes. Abdomen: Abdomen soft.  no TTP. No ascites, distension, hepatosplenomegaly.   MSK: No deformities or effusions. Neuro: Awake and alert.  EOMI, moves all extremities. Speech fluent.    Psych: Sensorium intact and responding to questions, attention normal. Affect pleasant.  Judgment and insight appear impaired by dementia. 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    Data Reviewed: I have personally reviewed following labs and imaging studies:  CBC: Recent Labs  Lab 01/11/19 1350 01/11/19 2013 01/12/19 0334 01/13/19 0217  WBC 19.8*  --  23.0* 18.8*  NEUTROABS 18.6*  --   --   --   HGB 11.8* 10.2* 11.5* 11.1*  HCT 37.6 30.0* 35.6* 34.3*  MCV 88.9  --  88.1 86.4  PLT 211  --  216 295   Basic Metabolic Panel: Recent Labs  Lab 01/11/19 1350 01/11/19 2013 01/12/19 0334 01/13/19 0217  NA 138 135 136 135  K 4.1 4.2 3.5 3.6  CL 104  --  105 105  CO2 26  --  24 20*  GLUCOSE 98  --  111* 110*  BUN 18  --  19 23  CREATININE 0.99  --  0.85 0.83  CALCIUM 8.6*  --  8.1* 8.3*  MG  --   --  1.7  --   PHOS  --   --  3.2  --     GFR: Estimated Creatinine Clearance: 44.5 mL/min (by C-G formula based on SCr of 0.83 mg/dL). Liver Function Tests: Recent Labs  Lab 01/11/19 1350 01/12/19 0334  AST 31 28  ALT 9 9  ALKPHOS 76 77  BILITOT 1.1 1.0  PROT 6.3* 5.4*  ALBUMIN 3.0* 2.6*   No results for input(s): LIPASE, AMYLASE in the last 168 hours. No results for input(s): AMMONIA in the last 168  hours. Coagulation Profile: No results for input(s): INR, PROTIME in the last 168 hours. Cardiac Enzymes: No results for input(s): CKTOTAL, CKMB, CKMBINDEX, TROPONINI in the last 168 hours. BNP (last 3 results) No results for input(s): PROBNP in the last 8760 hours. HbA1C: No results for input(s): HGBA1C in the last 72 hours. CBG: No results for input(s): GLUCAP in the last 168 hours. Lipid Profile: No results for input(s): CHOL, HDL, LDLCALC, TRIG, CHOLHDL, LDLDIRECT in the last 72 hours. Thyroid Function Tests: Recent Labs    01/12/19 0334  TSH 1.084   Anemia Panel: No results for input(s): VITAMINB12, FOLATE, FERRITIN, TIBC, IRON, RETICCTPCT in the last 72 hours. Urine analysis:    Component Value Date/Time   COLORURINE YELLOW 01/11/2019 2049   APPEARANCEUR HAZY (A) 01/11/2019 2049   LABSPEC >1.046 (H) 01/11/2019 2049   PHURINE 5.0 01/11/2019 2049   GLUCOSEU NEGATIVE 01/11/2019 2049   HGBUR MODERATE (A) 01/11/2019 2049   BILIRUBINUR NEGATIVE 01/11/2019 2049   KETONESUR 20 (A) 01/11/2019 2049   PROTEINUR NEGATIVE 01/11/2019 2049   NITRITE POSITIVE (A) 01/11/2019 2049   LEUKOCYTESUR SMALL (A) 01/11/2019 2049   Sepsis Labs: @LABRCNTIP (procalcitonin:4,lacticacidven:4)  ) Recent Results (from the past 240 hour(s))  Culture, blood (routine x 2)     Status: None (Preliminary result)   Collection Time: 01/11/19  2:05 PM  Result Value Ref Range Status   Specimen Description BLOOD RIGHT ANTECUBITAL  Final   Special Requests   Final    BOTTLES DRAWN AEROBIC AND ANAEROBIC Blood Culture adequate volume    Culture  Setup Time   Final    GRAM NEGATIVE RODS IN BOTH AEROBIC AND ANAEROBIC BOTTLES CRITICAL VALUE NOTED.  VALUE IS CONSISTENT WITH PREVIOUSLY REPORTED AND CALLED VALUE. Performed at [facility], [place] [address] [place], [place] [address]    Culture GRAM NEGATIVE RODS  Final   Report Status PENDING  Incomplete  Culture, blood (routine x 2)     Status: None (Preliminary result)   Collection Time: 01/11/19  2:10 PM  Result Value Ref Range Status   Specimen Description BLOOD LEFT HAND  Final   Special Requests   Final    BOTTLES DRAWN AEROBIC AND ANAEROBIC Blood Culture adequate volume   Culture  Setup Time   Final    GRAM POSITIVE COCCI ANAEROBIC BOTTLE ONLY GRAM NEGATIVE RODS IN BOTH AEROBIC AND ANAEROBIC BOTTLES CRITICAL RESULT CALLED TO, READ BACK BY AND VERIFIED WITH: Karlene Einstein PharmD 10:15 01/12/19 (wilsonm) Performed at [facility], [place] [address] [place], [place] [address]    Culture   Final    Lonell Grandchild NEGATIVE RODS KLEBSIELLA OXYTOCA STAPHYLOCOCCUS SPECIES (COAGULASE NEGATIVE)    Report Status PENDING  Incomplete  Blood Culture ID Panel (Reflexed)     Status: Abnormal   Collection Time: 01/11/19  2:10 PM  Result Value Ref Range Status   Enterococcus species NOT DETECTED NOT DETECTED Final   Listeria monocytogenes NOT DETECTED NOT DETECTED Final   Staphylococcus species DETECTED (A) NOT DETECTED Final    Comment: Methicillin (oxacillin) susceptible coagulase negative staphylococcus. Possible blood culture contaminant (unless isolated from more than one blood culture draw or clinical case suggests pathogenicity). No antibiotic treatment is indicated for blood  culture contaminants. CRITICAL RESULT CALLED TO, READ BACK BY AND VERIFIED WITH: Karlene Einstein PharmD 10:15 01/12/19 (wilsonm)    Staphylococcus aureus (BCID) NOT DETECTED NOT DETECTED Final   Methicillin resistance NOT DETECTED NOT DETECTED Final   Streptococcus species NOT DETECTED  NOT DETECTED  Final   Streptococcus agalactiae NOT DETECTED NOT DETECTED Final   Streptococcus pneumoniae NOT DETECTED NOT DETECTED Final   Streptococcus pyogenes NOT DETECTED NOT DETECTED Final   Acinetobacter baumannii NOT DETECTED NOT DETECTED Final   Enterobacteriaceae species DETECTED (A) NOT DETECTED Final    Comment: Enterobacteriaceae represent a large family of gram-negative bacteria, not a single organism. CRITICAL RESULT CALLED TO, READ BACK BY AND VERIFIED WITH: Karlene Einstein PharmD 10:15 01/12/19 (wilsonm)    Enterobacter cloacae complex NOT DETECTED NOT DETECTED Final   Escherichia coli NOT DETECTED NOT DETECTED Final   Klebsiella oxytoca DETECTED (A) NOT DETECTED Final    Comment: CRITICAL RESULT CALLED TO, READ BACK BY AND VERIFIED WITH: Karlene Einstein PharmD 10:15 01/12/19 (wilsonm)    Klebsiella pneumoniae NOT DETECTED NOT DETECTED Final   Proteus species NOT DETECTED NOT DETECTED Final   Serratia marcescens NOT DETECTED NOT DETECTED Final   Carbapenem resistance NOT DETECTED NOT DETECTED Final   Haemophilus influenzae NOT DETECTED NOT DETECTED Final   Neisseria meningitidis NOT DETECTED NOT DETECTED Final   Pseudomonas aeruginosa DETECTED (A) NOT DETECTED Final    Comment: CRITICAL RESULT CALLED TO, READ BACK BY AND VERIFIED WITH: Karlene Einstein PharmD 10:15 01/12/19 (wilsonm)    Candida albicans NOT DETECTED NOT DETECTED Final   Candida glabrata NOT DETECTED NOT DETECTED Final   Candida krusei NOT DETECTED NOT DETECTED Final   Candida parapsilosis NOT DETECTED NOT DETECTED Final   Candida tropicalis NOT DETECTED NOT DETECTED Final    Comment: Performed at [facility], [place]. [address], [place], [place] [address]  Respiratory Panel by PCR     Status: None   Collection Time: 01/11/19  3:00 PM  Result Value Ref Range Status   Adenovirus NOT DETECTED NOT DETECTED Final   Coronavirus 229E NOT DETECTED NOT DETECTED Final    Comment: (NOTE) The Coronavirus on the Respiratory  Panel, DOES NOT test for the novel  Coronavirus (2019 nCoV)    Coronavirus HKU1 NOT DETECTED NOT DETECTED Final   Coronavirus NL63 NOT DETECTED NOT DETECTED Final   Coronavirus OC43 NOT DETECTED NOT DETECTED Final   Metapneumovirus NOT DETECTED NOT DETECTED Final   Rhinovirus / Enterovirus NOT DETECTED NOT DETECTED Final   Influenza A NOT DETECTED NOT DETECTED Final   Influenza B NOT DETECTED NOT DETECTED Final   Parainfluenza Virus 1 NOT DETECTED NOT DETECTED Final   Parainfluenza Virus 2 NOT DETECTED NOT DETECTED Final   Parainfluenza Virus 3 NOT DETECTED NOT DETECTED Final   Parainfluenza Virus 4 NOT DETECTED NOT DETECTED Final   Respiratory Syncytial Virus NOT DETECTED NOT DETECTED Final   Bordetella pertussis NOT DETECTED NOT DETECTED Final   Chlamydophila pneumoniae NOT DETECTED NOT DETECTED Final   Mycoplasma pneumoniae NOT DETECTED NOT DETECTED Final    Comment: Performed at [facility], [place]. [address] [place], [place] [address]  Urine culture     Status: Abnormal (Preliminary result)   Collection Time: 01/11/19  8:49 PM  Result Value Ref Range Status   Specimen Description URINE, RANDOM  Final   Special Requests NONE  Final   Culture (A)  Final    >=100,000 COLONIES/mL STAPHYLOCOCCUS EPIDERMIDIS SUSCEPTIBILITIES TO FOLLOW Performed at [facility], [place] [address], [place], [place] [address]    Report Status PENDING  Incomplete         Radiology Studies: Ct Abdomen Pelvis W Contrast  Result Date: 01/11/2019 CLINICAL DATA:  85 year old female  with confusion. Fever. Known aneurysm which is stable. Prior appendectomy. Initial encounter. EXAM: CT ABDOMEN AND PELVIS WITH CONTRAST TECHNIQUE: Multidetector CT imaging of the abdomen and pelvis was performed using the standard protocol following bolus administration of intravenous contrast. CONTRAST:  127mL OMNIPAQUE IOHEXOL 300 MG/ML  SOLN COMPARISON:  Pelvic CT 07/07/2018. 06/16/2017 abdominal aortic ultrasound.  FINDINGS: Lower chest: Basilar parenchymal changes suggestive of scarring/atelectasis. Peribronchial thickening left lower lobe may represent chronic changes although can not exclude component of bronchitis. Left base small calcified granuloma. Cardiomegaly. Mitral valve calcification. Coronary artery calcification. Hepatobiliary: Motion degraded exam. No worrisome hepatic lesion. No calcified gallstones. Limited for evaluating for gallbladder inflammation secondary to motion artifact. Pancreas: Motion degraded exam. No worrisome pancreatic mass or inflammation noted. Spleen: Motion degraded exam causes artifact near the hilum. Adrenals/Urinary Tract: Motion degraded exam. No obstructing stone or hydronephrosis. No worrisome renal mass. Right lower pole 7 mm nonobstructing stone. No worrisome adrenal lesion. Noncontrast filled views of the urinary bladder unremarkable. Stomach/Bowel: Significant sigmoid diverticula and muscular hypertrophy. Slightly engorged vessels within the sigmoid mesentery but without discrete evidence of diverticulitis. Other portions of bowel are limited by motion degradation however, no extraluminal bowel inflammatory process noted. Moderate-size hiatal hernia. Partially fluid-filled normal size small bowel loops. Vascular/Lymphatic: Prominent atherosclerotic changes abdominal aorta and aortic branch vessels. Infrarenal abdominal aortic aneurysm measuring up to 3.2 cm versus 3 cm on prior ultrasound. Prominent narrowing origin of the celiac artery, superior mesenteric artery, inferior mesenteric artery and renal arteries. Significant narrowing iliac arteries. Scattered normal size lymph nodes. Reproductive: No worrisome uterine or adnexal mass. Other: No free intraperitoneal air or bowel containing hernia. Musculoskeletal: Remote left pubic body fracture with incomplete healing. Remote sacral fractures. Prominent scoliosis lower thoracic and lumbar spine convex left with superimposed  degenerative changes. IMPRESSION: 1. Exam is motion degraded which limits evaluation for detection of an inflammatory process (particularly at the level of the gallbladder and upper abdomen). Taking this limitation into account, no discrete inflammatory process is noted. 2. Significant sigmoid diverticulosis with muscular hypertrophy. Slightly engorged vessels within the sigmoid mesentery but without discrete findings of diverticulitis. 3. Moderate size hiatal hernia. 4. Peribronchial thickening left lower lobe may represent chronic changes although can not exclude component of bronchitis. Basilar atelectasis/scarring. 5. Cardiomegaly.  Coronary artery calcification. 6. Aortic Atherosclerosis (ICD10-I70.0). Abdominal aortic aneurysm measures 3.2 cm versus prior 3 cm. Recommend followup by ultrasound in 3 years. This recommendation follows ACR consensus guidelines: White Paper of the ACR Incidental Findings Committee II on Vascular Findings. J Am Coll Radiol 2013; 02:774-128 7. Prominent narrowing origin of the celiac artery, superior mesenteric artery, inferior mesenteric artery and renal arteries. Significant narrowing iliac arteries. 8. Remote left pubic body fracture with incomplete healing. Remote sacral fractures. 9. Prominent scoliosis lumbar spine convex left with superimposed degenerative changes. Electronically Signed   By: Genia Del M.D.   On: 01/11/2019 17:31        Scheduled Meds: . acetaminophen  1,000 mg Oral QHS  . apixaban  2.5 mg Oral BID  . aspirin  81 mg Oral Daily  . cycloSPORINE  1 drop Both Eyes BID  . donepezil  5 mg Oral Daily  . guaiFENesin  600 mg Oral BID  . metoprolol tartrate  5 mg Intravenous Once  . metoprolol tartrate  50 mg Oral BID  . valACYclovir  500 mg Oral Daily   Continuous Infusions: . ceFEPime (MAXIPIME) IV 2 g (01/13/19 1012)     LOS: 2 days  Time spent: 35 minutes    Edwin Dada, MD [facility] 01/13/2019, 4:16 PM      Please page through [place]:  [email or web address] Password [email or web address] If 7PM-7AM, please contact night-coverage

## 2019-01-13 NOTE — Care Management Note (Signed)
Case Management Note  Patient Details  Name: Amanda Hinton MRN: [id number] Date of Birth: 1930/07/11  Subjective/Objective:                    Action/Plan:  Continue to follow for possible home oxygen needs.    Expected Discharge Date:                  Expected Discharge Plan:  [place]  In-House Referral:     Discharge planning Services  CM Consult  Post Acute Care Choice:  Durable Medical Equipment, Home Health Choice offered to:  Patient, Adult Children  DME Arranged:  3-N-1 DME Agency:  [place] Arranged:  RN, Disease Management, PT, OT, Nurse's Aide, Social Work [employer] Agency:  [place]  Status of Service:  In process, will continue to follow  If discussed at Long Length of Stay Meetings, dates discussed:    Additional Comments:  Marilu Favre, RN 01/13/2019, 10:22 AM

## 2019-01-14 ENCOUNTER — Inpatient Hospital Stay: Payer: Self-pay

## 2019-01-14 DIAGNOSIS — I480 Paroxysmal atrial fibrillation: Secondary | ICD-10-CM

## 2019-01-14 LAB — URINE CULTURE: Culture: >=100000 — AB

## 2019-01-14 LAB — BASIC METABOLIC PANEL
Anion gap: 8 (ref 5–15)
BUN: 24 mg/dL — ABNORMAL HIGH (ref 8–23)
CO2: 21 mmol/L — AB (ref 22–32)
Calcium: 8.3 mg/dL — ABNORMAL LOW (ref 8.9–10.3)
Chloride: 106 mmol/L (ref 98–111)
Creatinine, Ser: 0.86 mg/dL (ref 0.44–1.00)
GFR calc Af Amer: >60 mL/min (ref >60)
GFR calc non Af Amer: >60 mL/min (ref >60)
Glucose, Bld: 108 mg/dL — ABNORMAL HIGH (ref 70–99)
Potassium: 3.6 mmol/L (ref 3.5–5.1)
Sodium: 135 mmol/L (ref 135–145)

## 2019-01-14 LAB — MAGNESIUM: Magnesium: 1.9 mg/dL (ref 1.7–2.4)

## 2019-01-14 LAB — CBC
HCT: 33.3 % — ABNORMAL LOW (ref 36.0–46.0)
Hemoglobin: 10.8 g/dL — ABNORMAL LOW (ref 12.0–15.0)
MCH: 27.4 pg (ref 26.0–34.0)
MCHC: 32.4 g/dL (ref 30.0–36.0)
MCV: 84.5 fL (ref 80.0–100.0)
Platelets: 242 10*3/uL (ref 150–400)
RBC: 3.94 MIL/uL (ref 3.87–5.11)
RDW: 13.9 % (ref 11.5–15.5)
WBC: 16.6 10*3/uL — ABNORMAL HIGH (ref 4.0–10.5)
nRBC: 0 % (ref 0.0–0.2)

## 2019-01-14 LAB — CULTURE, BLOOD (ROUTINE X 2): SPECIAL REQUESTS: ADEQUATE

## 2019-01-14 LAB — TSH: TSH: 2.107 u[IU]/mL (ref 0.350–4.500)

## 2019-01-14 MED ORDER — METOPROLOL TARTRATE 50 MG PO TABS
75.0000 mg | ORAL_TABLET | Freq: Two times a day (BID) | ORAL | Status: DC
  Administered 2019-01-14 (×2): 75 mg via ORAL
  Filled 2019-01-14 (×2): qty 1

## 2019-01-14 NOTE — Progress Notes (Signed)
Spoke with daughter regarding PICC line insertion explaining benefits and risks.  Agreeable to PICC insertion but wishes to waited until tomorrow afternoon when she is here to discuss with patient.  Will follow up tomorrow.

## 2019-01-14 NOTE — Plan of Care (Signed)

## 2019-01-14 NOTE — NC FL2 (Signed)
[place] LEVEL OF CARE SCREENING TOOL     IDENTIFICATION  Patient Name: Amanda Hinton Birthdate: March 12, 1930 Sex: female Admission Date (Current Location): 01/11/2019  [facility] and [place] Number:  Herbalist and Address:  [facility], [place] [address] [place], [place] [address]      Provider Number: [id number]  Attending Physician Name and Address:  Edwin Dada, *  Relative Name and Phone Number:  Amanda Hinton; daughter; [phone]    Current Level of Care: Hospital Recommended Level of Care: [place] Prior Approval Number:    Date Approved/Denied:   PASRR Number: [id number]  Discharge Plan: SNF    Current Diagnoses: Patient Active Problem List   Diagnosis Date Noted  . SIRS (systemic inflammatory response syndrome) ([facility]) 01/11/2019  . CAP (community acquired pneumonia) 01/11/2019  . Acute respiratory failure with hypoxia ([facility]) 01/11/2019  . PNEUMONIA ORGANISM NOS 01/24/2011  . Hyperlipidemia, unspecified 01/23/2011  . Essential hypertension 01/23/2011  . COPD  GOLD II 01/23/2011  . OSTEOPOROSIS 01/23/2011    Orientation RESPIRATION BLADDER Height & Weight     Self  O2(2L nasal canula) Incontinent Weight: 130 lb (59 kg) Height:  5\' 6"  (167.6 cm)  BEHAVIORAL SYMPTOMS/MOOD NEUROLOGICAL BOWEL NUTRITION STATUS      Continent Diet(see discharge summary)  AMBULATORY STATUS COMMUNICATION OF NEEDS Skin   Extensive Assist Verbally Normal(dry skin)                       Personal Care Assistance Level of Assistance  Feeding, Bathing, Dressing Bathing Assistance: Limited assistance Feeding assistance: Independent Dressing Assistance: Limited assistance     Functional Limitations Info  Sight, Hearing, Speech Sight Info: Adequate(wears glasses) Hearing Info: Adequate Speech Info: Adequate    SPECIAL CARE FACTORS FREQUENCY  PT (By licensed PT), OT (By licensed OT)     PT  Frequency: 5x week OT Frequency: 5x week            Contractures Contractures Info: Not present    Additional Factors Info  Code Status, Allergies, Psychotropic Code Status Info: Full Code Allergies Info: AVELOX MOXIFLOXACIN HCL IN NACL, PREDNISONE  Psychotropic Info: donepezil (ARICEPT) tablet 5 mg daily PO         Current Medications (01/14/2019):  This is the current hospital active medication list Current Facility-Administered Medications  Medication Dose Route Frequency Provider Last Rate Last Dose  . acetaminophen (TYLENOL) tablet 650 mg  650 mg Oral Q6H PRN Toy Baker, MD   650 mg at 01/14/19 0816   Or  . acetaminophen (TYLENOL) suppository 650 mg  650 mg Rectal Q6H PRN Doutova, Anastassia, MD      . acetaminophen (TYLENOL) tablet 1,000 mg  1,000 mg Oral QHS Edwin Dada, MD   1,000 mg at 01/13/19 2104  . albuterol (PROVENTIL) (2.5 MG/3ML) 0.083% nebulizer solution 2.5 mg  2.5 mg Nebulization Q2H PRN Doutova, Anastassia, MD   2.5 mg at 01/12/19 0128  . apixaban (ELIQUIS) tablet 2.5 mg  2.5 mg Oral BID Edwin Dada, MD   2.5 mg at 01/14/19 0815  . bisacodyl (DULCOLAX) suppository 10 mg  10 mg Rectal Daily PRN Doutova, Anastassia, MD      . ceFEPIme (MAXIPIME) 2 g in sodium chloride 0.9 % 100 mL IVPB  2 g Intravenous Q12H Reginia Naas, RPH 200 mL/hr at 01/14/19 0941 2 g at 01/14/19 0941  . cycloSPORINE (RESTASIS) 0.05 % ophthalmic emulsion 1  drop  1 drop Both Eyes BID Toy Baker, MD   1 drop at 01/14/19 0818  . docusate sodium (COLACE) capsule 100 mg  100 mg Oral Daily PRN Toy Baker, MD   100 mg at 01/14/19 0815  . donepezil (ARICEPT) tablet 5 mg  5 mg Oral Daily Doutova, Anastassia, MD   5 mg at 01/14/19 0815  . guaiFENesin (MUCINEX) 12 hr tablet 600 mg  600 mg Oral BID Toy Baker, MD   600 mg at 01/14/19 0816  . ipratropium-albuterol (DUONEB) 0.5-2.5 (3) MG/3ML nebulizer solution 3 mL  3 mL Nebulization Q4H PRN  Danford, Suann Larry, MD      . metoprolol tartrate (LOPRESSOR) injection 5 mg  5 mg Intravenous Q6H PRN Edwin Dada, MD   5 mg at 01/14/19 0534  . metoprolol tartrate (LOPRESSOR) tablet 75 mg  75 mg Oral BID Edwin Dada, MD   75 mg at 01/14/19 7579  . ondansetron (ZOFRAN) tablet 4 mg  4 mg Oral Q6H PRN Toy Baker, MD       Or  . ondansetron (ZOFRAN) injection 4 mg  4 mg Intravenous Q6H PRN Doutova, Anastassia, MD      . polyethylene glycol (MIRALAX / GLYCOLAX) packet 17 g  17 g Oral Daily PRN Doutova, Anastassia, MD      . valACYclovir (VALTREX) tablet 500 mg  500 mg Oral Daily Doutova, Anastassia, MD   500 mg at 01/14/19 0831     Discharge Medications: Please see discharge summary for a list of discharge medications.  Relevant Imaging Results:  Relevant Lab Results:   Additional Information SS#[id number] [facility], [place]

## 2019-01-14 NOTE — Social Work (Signed)
Provided CMS packet to pt daughter, she prefers [employer]. Updated [facility] [employer] liaison.  CSW continuing to follow for support with disposition when medically appropriate.  Westley Hummer, MSW, [place] Work [phone]

## 2019-01-14 NOTE — Progress Notes (Signed)
PROGRESS NOTE    Amanda Hinton  OIB:[id number] DOB: 19-May-1930 DOA: 01/11/2019 PCP: Lawerance Cruel, MD      Brief Narrative:  Mrs. Amanda Hinton is a 85 y.o. F with dementia, community-dwelling, AAA 3.2cm in surveillance, and COPD no O2 who presented with fever, malaise.  In ER, CXR clear, UA suggestive of infection.  CT abdomen unremarkable. Cultures obtained and started on broad spectrum antibiotics for sepsis.  Blood cultures subsequently growing Klebsiella and Pseudomonas.      Assessment & Plan:  Sepsis from Pseudmonas and Klebsiella bacteremia Suspected Staph epi contaminant Suspected source urine given GNR bacteremia, CXR without clear lobar pneumonia.    No recurrent fever.  Delirium back to baseline dementia now.  Still hypoxic.  Renal function normal.    Cultures and sensitivies reviewed below, Pseud and Klebs.  Cefepime susceptible. -Continue Cefepime for total 14 days, will need PICC -PICC line ordered   Delirium Dementia Resolved, patietn back to baseline dementia Delirium precautions:   -Lights and TV off, minimize interruptions at night  -Blinds open and lights on during day  -Glasses/hearing aid with patient  -Frequent reorientation  -PT/OT when able  -Avoid sedation medications/[place] list medications   COPD No active disease  Hypoxia Likely from sepsis, still requiring O2 -Wean O2 as able -Aggressive pulmonary toilet  New onset atrial fibrillation CHA2DS2-Vasc 4 from age, gender, PVD (family deny any history of HTN).  No prior stroke.  HASBLED 1, low risk.   TSH normal, Mag normal. HR improved with increased metoprolol this morning to 100s -Continue Eliquis, 2.5 dose given age, weight -Continue metoprolol 75 BID -Monitor on telemetry -Stop aspirin, no compelling indication to conitnue -Obtain Echo as outpatient  Mild anemia Possibly dilutional.  Normocytic.  No clinical bleeding. -Follow up CBC In 1 month after d/c   Other  medications -Continue Valtrex     MDM and disposition: Below labs and imaging reports were reviewed and summarized above.  Medication management as above.  The patient was admitted with sepsis from UTI, with Pseudomonas and Klebsiella bacteremia.  She will have to have a PEG placed, and extended course of IV antibiotics.  She also had onset of new paroxysmal atrial fibrillation.  She was started on a blood thinner, additional work-up as outlined above.    PT recommend SNF and so we will prepare for placement as we wean O2, titrate metoprolol/rate controlling agents, place PICC.      DVT prophylaxis: N/A Code Status: FULL Family Communication: Daughter    Consultants:   Infectious disease  Procedures:   None  Antimicrobials:  Anti-infectives (From admission, onward)   Start     Dose/Rate Route Frequency Ordered Stop   01/12/19 1700  vancomycin (VANCOCIN) IVPB 750 mg/150 ml premix  Status:  Discontinued     750 mg 150 mL/hr over 60 Minutes Intravenous Every 24 hours 01/11/19 1524 01/12/19 1028   01/12/19 1600  ceFEPIme (MAXIPIME) 2 g in sodium chloride 0.9 % 100 mL IVPB  Status:  Discontinued     2 g 200 mL/hr over 30 Minutes Intravenous Every 24 hours 01/11/19 1524 01/12/19 1027   01/12/19 1100  ceFEPIme (MAXIPIME) 2 g in sodium chloride 0.9 % 100 mL IVPB     2 g 200 mL/hr over 30 Minutes Intravenous Every 12 hours 01/12/19 1027     01/12/19 1000  valACYclovir (VALTREX) tablet 500 mg  Status:  Discontinued     500 mg Oral Daily 01/11/19 2259 01/12/19 0024  01/12/19 0030  valACYclovir (VALTREX) tablet 500 mg     500 mg Oral Daily 01/12/19 0024     01/11/19 1515  ceFEPIme (MAXIPIME) 2 g in sodium chloride 0.9 % 100 mL IVPB     2 g 200 mL/hr over 30 Minutes Intravenous  Once 01/11/19 1514 01/11/19 1856   01/11/19 1515  metroNIDAZOLE (FLAGYL) IVPB 500 mg  Status:  Discontinued     500 mg 100 mL/hr over 60 Minutes Intravenous Every 8 hours 01/11/19 1514 01/12/19 1028    01/11/19 1515  vancomycin (VANCOCIN) IVPB 1000 mg/200 mL premix     1,000 mg 200 mL/hr over 60 Minutes Intravenous  Once 01/11/19 1514 01/11/19 1856          Subjective: No chest discomfort, dyspnea, orthopnea, leg swelling.  No chest pain.  No confusion, vomiting, fever, abdominal pain.     Objective: Vitals:   01/13/19 1446 01/13/19 1712 01/13/19 2128 01/14/19 0531  BP:  132/82 120/67 (!) 168/96  Pulse:  (!) 108 (!) 102 (!) 115  Resp:   20 20  Temp:   97.8 F (36.6 C) (!) 97.4 F (36.3 C)  TempSrc:   Oral Oral  SpO2: 90% 95% (!) 79% 96%  Weight:      Height:        Intake/Output Summary (Last 24 hours) at 01/14/2019 1541 Last data filed at 01/14/2019 1300 Gross per 24 hour  Intake 380 ml  Output 25 ml  Net 355 ml   Filed Weights   01/11/19 1338  Weight: 59 kg    Examination: General appearance: Almyra Free adult female, sitting in recliner, no acute distress HEENT: Pupils small but equal and reactive, anicteric, conjunctival lids and lashes normal.  No nasal deformity, discharge, or epistaxis.  Lips moist, dentures in place.  Oropharynx moist, no oral lesions.  Hearing diminished. Skin: Warm and dry without jaundice, or suspicious rashes or lesions. Cardiac: Cardiac, regular, no murmurs appreciated, JVP not visible, no lower extremity edema. Respiratory: Normal respiratory rate and rhythm, lungs clear without rales or wheezes. Abdomen: Abdomen soft without tenderness to palpation, ascites, distention, or hepatosplenomegaly. MSK: No deformities or effusions. Neuro: Awake and alert.  EOMI, moves all extremities. Speech fluent.    Psych: Sensorium intact and responding to questions, but tangential, and difficult to keep on topic.  Affect pleasant.  Slightly anxious.  Judgment and insight appear impaired.   Data Reviewed: I have personally reviewed following labs and imaging studies:  CBC: Recent Labs  Lab 01/11/19 1350 01/11/19 2013 01/12/19 0334 01/13/19 0217  01/14/19 0347  WBC 19.8*  --  23.0* 18.8* 16.6*  NEUTROABS 18.6*  --   --   --   --   HGB 11.8* 10.2* 11.5* 11.1* 10.8*  HCT 37.6 30.0* 35.6* 34.3* 33.3*  MCV 88.9  --  88.1 86.4 84.5  PLT 211  --  216 198 440   Basic Metabolic Panel: Recent Labs  Lab 01/11/19 1350 01/11/19 2013 01/12/19 0334 01/13/19 0217 01/14/19 0347  NA 138 135 136 135 135  K 4.1 4.2 3.5 3.6 3.6  CL 104  --  105 105 106  CO2 26  --  24 20* 21*  GLUCOSE 98  --  111* 110* 108*  BUN 18  --  19 23 24*  CREATININE 0.99  --  0.85 0.83 0.86  CALCIUM 8.6*  --  8.1* 8.3* 8.3*  MG  --   --  1.7  --  1.9  PHOS  --   --  3.2  --   --    GFR: Estimated Creatinine Clearance: 42.9 mL/min (by C-G formula based on SCr of 0.86 mg/dL). Liver Function Tests: Recent Labs  Lab 01/11/19 1350 01/12/19 0334  AST 31 28  ALT 9 9  ALKPHOS 76 77  BILITOT 1.1 1.0  PROT 6.3* 5.4*  ALBUMIN 3.0* 2.6*   No results for input(s): LIPASE, AMYLASE in the last 168 hours. No results for input(s): AMMONIA in the last 168 hours. Coagulation Profile: No results for input(s): INR, PROTIME in the last 168 hours. Cardiac Enzymes: No results for input(s): CKTOTAL, CKMB, CKMBINDEX, TROPONINI in the last 168 hours. BNP (last 3 results) No results for input(s): PROBNP in the last 8760 hours. HbA1C: No results for input(s): HGBA1C in the last 72 hours. CBG: No results for input(s): GLUCAP in the last 168 hours. Lipid Profile: No results for input(s): CHOL, HDL, LDLCALC, TRIG, CHOLHDL, LDLDIRECT in the last 72 hours. Thyroid Function Tests: Recent Labs    01/14/19 0347  TSH 2.107   Anemia Panel: No results for input(s): VITAMINB12, FOLATE, FERRITIN, TIBC, IRON, RETICCTPCT in the last 72 hours. Urine analysis:    Component Value Date/Time   COLORURINE YELLOW 01/11/2019 2049   APPEARANCEUR HAZY (A) 01/11/2019 2049   LABSPEC >1.046 (H) 01/11/2019 2049   PHURINE 5.0 01/11/2019 2049   GLUCOSEU NEGATIVE 01/11/2019 2049   HGBUR  MODERATE (A) 01/11/2019 2049   BILIRUBINUR NEGATIVE 01/11/2019 2049   KETONESUR 20 (A) 01/11/2019 2049   PROTEINUR NEGATIVE 01/11/2019 2049   NITRITE POSITIVE (A) 01/11/2019 2049   LEUKOCYTESUR SMALL (A) 01/11/2019 2049   Sepsis Labs: @LABRCNTIP (procalcitonin:4,lacticacidven:4)  ) Recent Results (from the past 240 hour(s))  Culture, blood (routine x 2)     Status: Abnormal (Preliminary result)   Collection Time: 01/11/19  2:05 PM  Result Value Ref Range Status   Specimen Description BLOOD RIGHT ANTECUBITAL  Final   Special Requests   Final    BOTTLES DRAWN AEROBIC AND ANAEROBIC Blood Culture adequate volume   Culture  Setup Time   Final    GRAM NEGATIVE RODS IN BOTH AEROBIC AND ANAEROBIC BOTTLES CRITICAL VALUE NOTED.  VALUE IS CONSISTENT WITH PREVIOUSLY REPORTED AND CALLED VALUE.    Culture (A)  Final    PSEUDOMONAS AERUGINOSA KLEBSIELLA OXYTOCA SUSCEPTIBILITIES PERFORMED ON PREVIOUS CULTURE WITHIN THE LAST 5 DAYS. Performed at [facility], [place] [address], [place], [place] [address]    Report Status PENDING  Incomplete  Culture, blood (routine x 2)     Status: Abnormal   Collection Time: 01/11/19  2:10 PM  Result Value Ref Range Status   Specimen Description BLOOD LEFT HAND  Final   Special Requests   Final    BOTTLES DRAWN AEROBIC AND ANAEROBIC Blood Culture adequate volume   Culture  Setup Time   Final    GRAM POSITIVE COCCI ANAEROBIC BOTTLE ONLY GRAM NEGATIVE RODS IN BOTH AEROBIC AND ANAEROBIC BOTTLES CRITICAL RESULT CALLED TO, READ BACK BY AND VERIFIED WITH: Karlene Einstein PharmD 10:15 01/12/19 ([email or web address])    Culture (A)  Final    PSEUDOMONAS AERUGINOSA KLEBSIELLA OXYTOCA STAPHYLOCOCCUS SPECIES (COAGULASE NEGATIVE) THE SIGNIFICANCE OF ISOLATING THIS ORGANISM FROM A SINGLE SET OF BLOOD CULTURES WHEN MULTIPLE SETS ARE DRAWN IS UNCERTAIN. PLEASE NOTIFY THE MICROBIOLOGY DEPARTMENT WITHIN ONE WEEK IF SPECIATION AND SENSITIVITIES ARE REQUIRED. Performed at [facility], [place] [address] [place],  [address]    Report Status 01/14/2019 FINAL  Final   Organism  ID, Bacteria PSEUDOMONAS AERUGINOSA  Final   Organism ID, Bacteria KLEBSIELLA OXYTOCA  Final      Susceptibility   Klebsiella oxytoca - MIC*    AMPICILLIN >=32 RESISTANT Resistant     CEFAZOLIN >=64 RESISTANT Resistant     CEFEPIME <=1 SENSITIVE Sensitive     CEFTAZIDIME <=1 SENSITIVE Sensitive     CEFTRIAXONE <=1 SENSITIVE Sensitive     CIPROFLOXACIN <=0.25 SENSITIVE Sensitive     GENTAMICIN <=1 SENSITIVE Sensitive     IMIPENEM 0.5 SENSITIVE Sensitive     TRIMETH/SULFA <=20 SENSITIVE Sensitive     AMPICILLIN/SULBACTAM 8 SENSITIVE Sensitive     PIP/TAZO <=4 SENSITIVE Sensitive     Extended ESBL NEGATIVE Sensitive     * KLEBSIELLA OXYTOCA   Pseudomonas aeruginosa - MIC*    CEFTAZIDIME 4 SENSITIVE Sensitive     CIPROFLOXACIN <=0.25 SENSITIVE Sensitive     GENTAMICIN <=1 SENSITIVE Sensitive     IMIPENEM 8 INTERMEDIATE Intermediate     PIP/TAZO 8 SENSITIVE Sensitive     CEFEPIME 2 SENSITIVE Sensitive     * PSEUDOMONAS AERUGINOSA  Blood Culture ID Panel (Reflexed)     Status: Abnormal   Collection Time: 01/11/19  2:10 PM  Result Value Ref Range Status   Enterococcus species NOT DETECTED NOT DETECTED Final   Listeria monocytogenes NOT DETECTED NOT DETECTED Final   Staphylococcus species DETECTED (A) NOT DETECTED Final    Comment: Methicillin (oxacillin) susceptible coagulase negative staphylococcus. Possible blood culture contaminant (unless isolated from more than one blood culture draw or clinical case suggests pathogenicity). No antibiotic treatment is indicated for blood  culture contaminants. CRITICAL RESULT CALLED TO, READ BACK BY AND VERIFIED WITH: Karlene Einstein PharmD 10:15 01/12/19 (wilsonm)    Staphylococcus aureus (BCID) NOT DETECTED NOT DETECTED Final   Methicillin resistance NOT DETECTED NOT DETECTED Final   Streptococcus species NOT DETECTED NOT DETECTED Final    Streptococcus agalactiae NOT DETECTED NOT DETECTED Final   Streptococcus pneumoniae NOT DETECTED NOT DETECTED Final   Streptococcus pyogenes NOT DETECTED NOT DETECTED Final   Acinetobacter baumannii NOT DETECTED NOT DETECTED Final   Enterobacteriaceae species DETECTED (A) NOT DETECTED Final    Comment: Enterobacteriaceae represent a large family of gram-negative bacteria, not a single organism. CRITICAL RESULT CALLED TO, READ BACK BY AND VERIFIED WITH: Karlene Einstein PharmD 10:15 01/12/19 (wilsonm)    Enterobacter cloacae complex NOT DETECTED NOT DETECTED Final   Escherichia coli NOT DETECTED NOT DETECTED Final   Klebsiella oxytoca DETECTED (A) NOT DETECTED Final    Comment: CRITICAL RESULT CALLED TO, READ BACK BY AND VERIFIED WITH: Karlene Einstein PharmD 10:15 01/12/19 ([email or web address])    Klebsiella pneumoniae NOT DETECTED NOT DETECTED Final   Proteus species NOT DETECTED NOT DETECTED Final   Serratia marcescens NOT DETECTED NOT DETECTED Final   Carbapenem resistance NOT DETECTED NOT DETECTED Final   Haemophilus influenzae NOT DETECTED NOT DETECTED Final   Neisseria meningitidis NOT DETECTED NOT DETECTED Final   Pseudomonas aeruginosa DETECTED (A) NOT DETECTED Final    Comment: CRITICAL RESULT CALLED TO, READ BACK BY AND VERIFIED WITH: Karlene Einstein PharmD 10:15 01/12/19 ([email or web address])    Candida albicans NOT DETECTED NOT DETECTED Final   Candida glabrata NOT DETECTED NOT DETECTED Final   Candida krusei NOT DETECTED NOT DETECTED Final   Candida parapsilosis NOT DETECTED NOT DETECTED Final   Candida tropicalis NOT DETECTED NOT DETECTED Final    Comment: Performed at [facility], [place]. [address] [place], [place] [address]  Respiratory Panel by PCR     Status: None   Collection Time: 01/11/19  3:00 PM  Result Value Ref Range Status   Adenovirus NOT DETECTED NOT DETECTED Final   Coronavirus 229E NOT DETECTED NOT DETECTED Final    Comment: (NOTE) The Coronavirus on the Respiratory Panel, DOES  NOT test for the novel  Coronavirus (2019 nCoV)    Coronavirus HKU1 NOT DETECTED NOT DETECTED Final   Coronavirus NL63 NOT DETECTED NOT DETECTED Final   Coronavirus OC43 NOT DETECTED NOT DETECTED Final   Metapneumovirus NOT DETECTED NOT DETECTED Final   Rhinovirus / Enterovirus NOT DETECTED NOT DETECTED Final   Influenza A NOT DETECTED NOT DETECTED Final   Influenza B NOT DETECTED NOT DETECTED Final   Parainfluenza Virus 1 NOT DETECTED NOT DETECTED Final   Parainfluenza Virus 2 NOT DETECTED NOT DETECTED Final   Parainfluenza Virus 3 NOT DETECTED NOT DETECTED Final   Parainfluenza Virus 4 NOT DETECTED NOT DETECTED Final   Respiratory Syncytial Virus NOT DETECTED NOT DETECTED Final   Bordetella pertussis NOT DETECTED NOT DETECTED Final   Chlamydophila pneumoniae NOT DETECTED NOT DETECTED Final   Mycoplasma pneumoniae NOT DETECTED NOT DETECTED Final    Comment: Performed at [facility], [address] [place], [place] [address]  Urine culture     Status: Abnormal   Collection Time: 01/11/19  8:49 PM  Result Value Ref Range Status   Specimen Description URINE, RANDOM  Final   Special Requests   Final    NONE Performed at [facility], [place] [address], [place], [place] [address]    Culture >=100,000 COLONIES/mL STAPHYLOCOCCUS EPIDERMIDIS (A)  Final   Report Status 01/14/2019 FINAL  Final   Organism ID, Bacteria STAPHYLOCOCCUS EPIDERMIDIS (A)  Final      Susceptibility   Staphylococcus epidermidis - MIC*    CIPROFLOXACIN <=0.5 SENSITIVE Sensitive     GENTAMICIN <=0.5 SENSITIVE Sensitive     NITROFURANTOIN <=16 SENSITIVE Sensitive     OXACILLIN <=0.25 SENSITIVE Sensitive     TETRACYCLINE 2 SENSITIVE Sensitive     VANCOMYCIN <=0.5 SENSITIVE Sensitive     TRIMETH/SULFA 160 RESISTANT Resistant     CLINDAMYCIN <=0.25 SENSITIVE Sensitive     RIFAMPIN <=0.5 SENSITIVE Sensitive     Inducible Clindamycin NEGATIVE Sensitive     * >=100,000 COLONIES/mL STAPHYLOCOCCUS EPIDERMIDIS          Radiology Studies: No results found.      Scheduled Meds: . acetaminophen  1,000 mg Oral QHS  . apixaban  2.5 mg Oral BID  . cycloSPORINE  1 drop Both Eyes BID  . donepezil  5 mg Oral Daily  . guaiFENesin  600 mg Oral BID  . metoprolol tartrate  75 mg Oral BID  . valACYclovir  500 mg Oral Daily   Continuous Infusions: . ceFEPime (MAXIPIME) IV 2 g (01/14/19 0941)     LOS: 3 days    Time spent: 25 minutes    Edwin Dada, MD [facility] 01/14/2019, 3:41 PM     Please page through AMION:  [email or web address] Password [email or web address] If 7PM-7AM, please contact night-coverage

## 2019-01-14 NOTE — Social Work (Signed)
Pt family now wants SNF, will complete FL2 and fax out.  Westley Hummer, MSW, [place] Work [phone]

## 2019-01-14 NOTE — Progress Notes (Signed)
Physical Therapy Treatment Patient Details Name: Amanda Hinton MRN: [id number] DOB: [birth year] Today's Date: 01/14/2019    History of Present Illness 85 y.o. female with medical history significant of dementia, hypertension, hyperlipidemia, COPD, osteoporosis and AAA. Presented with confusion since this morning incontinent of urine. Dx SIRS (systemic inflammatory response syndrome) with likely source CAP.    PT Comments    Patient seen for mobility progression. Pt presents with confusion and agreeable to very limited mobility this session. Pt requires mod A for gait of a few feet. Daughter present and reports that pt is not at baseline. Pt is disoriented to place and situation. SpO2 87% on RA at rest and pt becoming SOB with sit to stand transfer and conversation. RN notified. Pt on RA upon arrival this session. Continue to progress as tolerated with anticipated d/c to SNF for further skilled PT services.     Follow Up Recommendations  SNF     Equipment Recommendations  Other (comment)(to be determined at next venue)    Recommendations for Other Services       Precautions / Restrictions Precautions Precautions: Fall Precaution Comments: reports fall a couple months ago, tailbone fx per dtr    Mobility  Bed Mobility               General bed mobility comments: OOB in recliner on entry  Transfers Overall transfer level: Needs assistance Equipment used: None Transfers: Sit to/from Stand Sit to Stand: Min assist         General transfer comment: assist to power up into standing and for balance  Ambulation/Gait Ambulation/Gait assistance: Mod assist Gait Distance (Feet): (4 ft forward and 4 ft backwards) Assistive device: 1 person hand held assist(rail in hallway) Gait Pattern/deviations: Step-through pattern;Decreased step length - right;Decreased step length - left;Shuffle;Trunk flexed     General Gait Details: cues for safety; pt agreeable to very limited  mobility; assistance required for balance   Stairs             Wheelchair Mobility    Modified Rankin (Stroke Patients Only)       Balance Overall balance assessment: Needs assistance Sitting-balance support: No upper extremity supported;Feet supported Sitting balance-Leahy Scale: Fair     Standing balance support: During functional activity;Bilateral upper extremity supported Standing balance-Leahy Scale: Poor                              Cognition Arousal/Alertness: Awake/alert Behavior During Therapy: Anxious Overall Cognitive Status: History of cognitive impairments - at baseline                                 General Comments: pt is confused and daughter present reporting that pt is not at baseline cognition      Exercises      General Comments General comments (skin integrity, edema, etc.): SpO2 87% on RA at rest; pt not on supplemental O2 upon arrival so RN notified of SpO2       Pertinent Vitals/Pain      Home Living                      Prior Function            PT Goals (current goals can now be found in the care plan section) Progress towards PT goals: Progressing toward goals  Frequency    Min 3X/week      PT Plan Current plan remains appropriate    Co-evaluation              AM-PAC PT "6 Clicks" Mobility   Outcome Measure  Help needed turning from your back to your side while in a flat bed without using bedrails?: A Little Help needed moving from lying on your back to sitting on the side of a flat bed without using bedrails?: A Little Help needed moving to and from a bed to a chair (including a wheelchair)?: A Little Help needed standing up from a chair using your arms (e.g., wheelchair or bedside chair)?: A Little Help needed to walk in hospital room?: A Little Help needed climbing 3-5 steps with a railing? : A Lot 6 Click Score: 17    End of Session Equipment Utilized During  Treatment: Gait belt Activity Tolerance: Patient limited by fatigue Patient left: in chair;with call bell/phone within reach;with chair alarm set;with family/visitor present Nurse Communication: Mobility status;Other (comment)(SpO2 87% on RA at rest) PT Visit Diagnosis: Unsteadiness on feet (R26.81);Other abnormalities of gait and mobility (R26.89);Muscle weakness (generalized) (M62.81);History of falling (Z91.81);Difficulty in walking, not elsewhere classified (R26.2)     Time: 1856-3149 PT Time Calculation (min) (ACUTE ONLY): 22 min  Charges:  $Gait Training: 8-22 mins                     Amanda Hinton, PTA Acute Rehabilitation Services Pager: [phone] Office: [phone]     Darliss Cheney 01/14/2019, 1:15 PM

## 2019-01-14 NOTE — Care Management Note (Addendum)
Case Management Note  Patient Details  Name: Amanda Hinton MRN: [id number] Date of Birth: [birth year]  Subjective/Objective:                    Action/Plan:  Spoke to patient at bedside, received permission to call daughter.   Left 30 free card at bedside for Apixaban  , entered benefits check for Apixaban.   Spoke with MD possible discharge tomorrow 01-16-19 with 1 week IV antibiotics.   Called Daughter  Amanda Hinton (Daughter)  Showing 1 of 1   [phone]      Awaiting call back.   Spoke with Daughter Amanda Hinton . Explained above.   Amanda Hinton willing to be taught how to administer antibiotic at home. [facility] for Infusion and [facility] for home health. Amanda Hinton aware home health will not be present every time a dose is due. Amanda Hinton works and does not live with patient. Aware recommendations 24 hour supervision.   Amanda Hinton wants to take her mother home and administrator antibiotic around her work schedule.   Will update [facility] and [facility].    Expected Discharge Date:                  Expected Discharge Plan:  [place]  In-House Referral:     Discharge planning Services  CM Consult  Post Acute Care Choice:  Durable Medical Equipment, Home Health Choice offered to:  Patient, Adult Children  DME Arranged:  3-N-1 DME Agency:  [place] Arranged:  RN, Disease Management, PT, OT, Nurse's Aide, Social Work [employer] Agency:  [facility]  Status of Service:  In process, will continue to follow  If discussed at Long Length of Stay Meetings, dates discussed:    Additional Comments:  Marilu Favre, RN 01/14/2019, 11:24 AM

## 2019-01-14 NOTE — Progress Notes (Signed)
[facility] Notified that patient HR 140's 5mg  Metoprolol given bp 168/96. HR 115-120 post medication. Arthor Captain LPN

## 2019-01-14 NOTE — Care Management (Signed)
#    4.   S/W   MARK @ [employer] #  [phone]  1. ELIQUIS  2.5 MG BID COVER- YES CO-PAY- $ 3.90 TIER- NO PRIOR APPROVAL- N O  2. ELIQUIS  5 MG BID COVER- YES CO-PAY- $ 3.90 TIER- NO PRIOR APPROVAL- NO  LOWE INCOME SUBSIDY  PREFERRED PHARMACY :  ANY RETAIL  SECONDARY INS : MEDICAID [place] ACCESS EFF-DATE: 12-19-2018 CO-PAY- $ 3.90 FOR EACH PRESCRIPTION

## 2019-01-15 ENCOUNTER — Encounter (HOSPITAL_COMMUNITY): Payer: Self-pay | Admitting: Surgery

## 2019-01-15 ENCOUNTER — Inpatient Hospital Stay (HOSPITAL_COMMUNITY): Payer: Medicare Other

## 2019-01-15 LAB — CULTURE, BLOOD (ROUTINE X 2): Special Requests: ADEQUATE

## 2019-01-15 LAB — CBC
HEMATOCRIT: 32.4 % — AB (ref 36.0–46.0)
HEMOGLOBIN: 10.4 g/dL — AB (ref 12.0–15.0)
MCH: 27.4 pg (ref 26.0–34.0)
MCHC: 32.1 g/dL (ref 30.0–36.0)
MCV: 85.5 fL (ref 80.0–100.0)
Platelets: 280 10*3/uL (ref 150–400)
RBC: 3.79 MIL/uL — ABNORMAL LOW (ref 3.87–5.11)
RDW: 14.3 % (ref 11.5–15.5)
WBC: 12.8 10*3/uL — ABNORMAL HIGH (ref 4.0–10.5)
nRBC: 0 % (ref 0.0–0.2)

## 2019-01-15 LAB — BASIC METABOLIC PANEL
Anion gap: 7 (ref 5–15)
BUN: 22 mg/dL (ref 8–23)
CO2: 21 mmol/L — ABNORMAL LOW (ref 22–32)
CREATININE: 0.88 mg/dL (ref 0.44–1.00)
Calcium: 8.4 mg/dL — ABNORMAL LOW (ref 8.9–10.3)
Chloride: 106 mmol/L (ref 98–111)
GFR calc Af Amer: >60 mL/min (ref >60)
GFR calc non Af Amer: 59 mL/min — ABNORMAL LOW (ref >60)
Glucose, Bld: 103 mg/dL — ABNORMAL HIGH (ref 70–99)
POTASSIUM: 3.4 mmol/L — AB (ref 3.5–5.1)
Sodium: 134 mmol/L — ABNORMAL LOW (ref 135–145)

## 2019-01-15 MED ORDER — POTASSIUM CHLORIDE CRYS ER 20 MEQ PO TBCR
40.0000 meq | EXTENDED_RELEASE_TABLET | Freq: Once | ORAL | Status: AC
  Administered 2019-01-15: 40 meq via ORAL
  Filled 2019-01-15: qty 2

## 2019-01-15 MED ORDER — METOPROLOL TARTRATE 100 MG PO TABS
100.0000 mg | ORAL_TABLET | Freq: Two times a day (BID) | ORAL | Status: DC
  Administered 2019-01-15 – 2019-01-16 (×3): 100 mg via ORAL
  Filled 2019-01-15 (×3): qty 1

## 2019-01-15 NOTE — Discharge Instructions (Signed)

## 2019-01-15 NOTE — Progress Notes (Signed)
PHARMACY CONSULT NOTE FOR:  Cefepime  OUTPATIENT  PARENTERAL ANTIBIOTIC THERAPY (OPAT)  Indication: bloodstream infection Regimen: cefepime 2g IV q12h End date: 01/25/2019  IV antibiotic discharge orders are pended. To discharging provider:  please sign these orders via discharge navigator,  Select New Orders & click on the button choice - Manage This Unsigned Work.     Thank you for allowing pharmacy to be a part of this patient's care.  Candie Mile 01/15/2019, 2:46 PM

## 2019-01-15 NOTE — Progress Notes (Signed)
PROGRESS NOTE    Amanda Hinton  LPF:[id number] DOB: September 26, 1930 DOA: 01/11/2019 PCP: Lawerance Cruel, MD      Brief Narrative:  Amanda Hinton is a 85 y.o. F with dementia, community-dwelling, AAA 3.2cm in surveillance, and COPD no O2 who presented with fever, malaise.  In ER, CXR clear, UA suggestive of infection.  CT abdomen unremarkable. Cultures obtained and started on broad spectrum antibiotics for sepsis.  Blood cultures subsequently growing Klebsiella and Pseudomonas.      Assessment & Plan:  Sepsis from Pseudmonas and Klebsiella bacteremia Suspected Staph epi contaminant Suspected source urine given GNR bacteremia, CXR without clear lobar pneumonia.    Still afebrile, has defervesced completely.  Hypoxia resolved, renal function normal.  Cultures and sensitivies reviewed below, Pseud and Klebs.  Cefepime susceptible. -Continue cefepime, needs total 14 days, PICC line pending, O. Pat orders pended    Delirium Dementia Resolved, patietn back to baseline dementia Delirium precautions:   -Lights and TV off, minimize interruptions at night  -Blinds open and lights on during day  -Glasses/hearing aid with patient  -Frequent reorientation  -PT/OT when able  -Avoid sedation medications/Beers list medications   COPD No active disease  Hypoxia Likely from sepsis, resolved  New onset atrial fibrillation CHA2DS2-Vasc 4 from age, gender, PVD (family deny any history of HTN).  No prior stroke.  HASBLED 1, low risk.   TSH normal, Mag normal.  Rate improved with titrating metoprolol -Continue Eliquis, 2.5 dose given age, weight -Continue metoprolol 100 twice daily -Monitor on telemetry -Stop aspirin, no compelling indication to conitnue -Obtain Echo as outpatient  Mild anemia Possibly dilutional.  Normocytic.  No clinical bleeding. -Follow up CBC In 1 month after d/c   Other medications -Continue Valtrex     MDM and disposition: The below labs and  imaging reports were reviewed and summarized above.  Medication management as above.  The patient was admitted with sepsis from UTI, Simona minus and Coxiella bacteremia. She has a PICC line placed, and will need an extended course of IV antibiotics, 2 weeks of cefepime.  She also has new onset atrial fibrillation for which she is started on a blood thinner.     Continue to titrate BP today, place PICC< and likely to SNF tomorrow    DVT prophylaxis: N/A Code Status: FULL Family Communication: Daughter    Consultants:   Infectious disease  Procedures:   None  Antimicrobials:  Anti-infectives (From admission, onward)   Start     Dose/Rate Route Frequency Ordered Stop   01/12/19 1700  vancomycin (VANCOCIN) IVPB 750 mg/150 ml premix  Status:  Discontinued     750 mg 150 mL/hr over 60 Minutes Intravenous Every 24 hours 01/11/19 1524 01/12/19 1028   01/12/19 1600  ceFEPIme (MAXIPIME) 2 g in sodium chloride 0.9 % 100 mL IVPB  Status:  Discontinued     2 g 200 mL/hr over 30 Minutes Intravenous Every 24 hours 01/11/19 1524 01/12/19 1027   01/12/19 1100  ceFEPIme (MAXIPIME) 2 g in sodium chloride 0.9 % 100 mL IVPB     2 g 200 mL/hr over 30 Minutes Intravenous Every 12 hours 01/12/19 1027     01/12/19 1000  valACYclovir (VALTREX) tablet 500 mg  Status:  Discontinued     500 mg Oral Daily 01/11/19 2259 01/12/19 0024   01/12/19 0030  valACYclovir (VALTREX) tablet 500 mg     500 mg Oral Daily 01/12/19 0024     01/11/19 1515  ceFEPIme (MAXIPIME)  2 g in sodium chloride 0.9 % 100 mL IVPB     2 g 200 mL/hr over 30 Minutes Intravenous  Once 01/11/19 1514 01/11/19 1856   01/11/19 1515  metroNIDAZOLE (FLAGYL) IVPB 500 mg  Status:  Discontinued     500 mg 100 mL/hr over 60 Minutes Intravenous Every 8 hours 01/11/19 1514 01/12/19 1028   01/11/19 1515  vancomycin (VANCOCIN) IVPB 1000 mg/200 mL premix     1,000 mg 200 mL/hr over 60 Minutes Intravenous  Once 01/11/19 1514 01/11/19 1856           Subjective: No new confusion, dyspnea, chest discomfort, orthopnea, leg swelling.  No chest pain, confusion, fever, vomiting, abdominal pain.      Objective: Vitals:   01/14/19 2140 01/14/19 2230 01/15/19 0517 01/15/19 1258  BP: (!) 151/92  135/88 118/82  Pulse: (!) 130 (!) 110 (!) 109 (!) 119  Resp:    (!) 22  Temp:   98.1 F (36.7 C) 98.4 F (36.9 C)  TempSrc:   Oral Oral  SpO2:   93% 95%  Weight:      Height:        Intake/Output Summary (Last 24 hours) at 01/15/2019 1702 Last data filed at 01/15/2019 0945 Gross per 24 hour  Intake 120 ml  Output 600 ml  Net -480 ml   Filed Weights   01/11/19 1338  Weight: 59 kg    Examination: General appearance: Elderly adult female, sitting in recliner, no acute distress HEENT: Pupils small but equal and reactive, anicteric, conjunctival lids and lashes normal.  No nasal deformity, discharge, or epistaxis.  Lips moist, dentures in place.  Oropharynx moist, no oral lesions.  Hearing diminished. Skin: Warm and dry without jaundice, or suspicious rashes or lesions. Cardiac: Tachycardic, irregular, no murmurs appreciated, JVP not visible, no lower extremity edema Respiratory: Normal respiratory rate and rhythm, lungs clear without rales or wheezes Abdomen: Soft without tenderness to palpation, ascites, distention, hepatosplenomegaly MSK: No deformities or effusions. Neuro: Awake and alert.  EOMI, moves all extremities. Speech fluent.    Psych: Sensorium intact and responding to questions, but tangential, and difficult to keep on topic.  Affect pleasant.  Slightly anxious.  Judgment and insight appear impaired.   Data Reviewed: I have personally reviewed following labs and imaging studies:  CBC: Recent Labs  Lab 01/11/19 1350 01/11/19 2013 01/12/19 0334 01/13/19 0217 01/14/19 0347 01/15/19 0124  WBC 19.8*  --  23.0* 18.8* 16.6* 12.8*  NEUTROABS 18.6*  --   --   --   --   --   HGB 11.8* 10.2* 11.5* 11.1* 10.8* 10.4*  HCT  37.6 30.0* 35.6* 34.3* 33.3* 32.4*  MCV 88.9  --  88.1 86.4 84.5 85.5  PLT 211  --  216 198 242 038   Basic Metabolic Panel: Recent Labs  Lab 01/11/19 1350 01/11/19 2013 01/12/19 0334 01/13/19 0217 01/14/19 0347 01/15/19 0124  NA 138 135 136 135 135 134*  K 4.1 4.2 3.5 3.6 3.6 3.4*  CL 104  --  105 105 106 106  CO2 26  --  24 20* 21* 21*  GLUCOSE 98  --  111* 110* 108* 103*  BUN 18  --  19 23 24* 22  CREATININE 0.99  --  0.85 0.83 0.86 0.88  CALCIUM 8.6*  --  8.1* 8.3* 8.3* 8.4*  MG  --   --  1.7  --  1.9  --   PHOS  --   --  3.2  --   --   --    GFR: Estimated Creatinine Clearance: 41.9 mL/min (by C-G formula based on SCr of 0.88 mg/dL). Liver Function Tests: Recent Labs  Lab 01/11/19 1350 01/12/19 0334  AST 31 28  ALT 9 9  ALKPHOS 76 77  BILITOT 1.1 1.0  PROT 6.3* 5.4*  ALBUMIN 3.0* 2.6*   No results for input(s): LIPASE, AMYLASE in the last 168 hours. No results for input(s): AMMONIA in the last 168 hours. Coagulation Profile: No results for input(s): INR, PROTIME in the last 168 hours. Cardiac Enzymes: No results for input(s): CKTOTAL, CKMB, CKMBINDEX, TROPONINI in the last 168 hours. BNP (last 3 results) No results for input(s): PROBNP in the last 8760 hours. HbA1C: No results for input(s): HGBA1C in the last 72 hours. CBG: No results for input(s): GLUCAP in the last 168 hours. Lipid Profile: No results for input(s): CHOL, HDL, LDLCALC, TRIG, CHOLHDL, LDLDIRECT in the last 72 hours. Thyroid Function Tests: Recent Labs    01/14/19 0347  TSH 2.107   Anemia Panel: No results for input(s): VITAMINB12, FOLATE, FERRITIN, TIBC, IRON, RETICCTPCT in the last 72 hours. Urine analysis:    Component Value Date/Time   COLORURINE YELLOW 01/11/2019 2049   APPEARANCEUR HAZY (A) 01/11/2019 2049   LABSPEC >1.046 (H) 01/11/2019 2049   PHURINE 5.0 01/11/2019 2049   GLUCOSEU NEGATIVE 01/11/2019 2049   HGBUR MODERATE (A) 01/11/2019 2049   BILIRUBINUR NEGATIVE  01/11/2019 2049   KETONESUR 20 (A) 01/11/2019 2049   PROTEINUR NEGATIVE 01/11/2019 2049   NITRITE POSITIVE (A) 01/11/2019 2049   LEUKOCYTESUR SMALL (A) 01/11/2019 2049   Sepsis Labs: @LABRCNTIP (procalcitonin:4,lacticacidven:4)  ) Recent Results (from the past 240 hour(s))  Culture, blood (routine x 2)     Status: Abnormal   Collection Time: 01/11/19  2:05 PM  Result Value Ref Range Status   Specimen Description BLOOD RIGHT ANTECUBITAL  Final   Special Requests   Final    BOTTLES DRAWN AEROBIC AND ANAEROBIC Blood Culture adequate volume   Culture  Setup Time   Final    GRAM NEGATIVE RODS IN BOTH AEROBIC AND ANAEROBIC BOTTLES CRITICAL VALUE NOTED.  VALUE IS CONSISTENT WITH PREVIOUSLY REPORTED AND CALLED VALUE.    Culture (A)  Final    PSEUDOMONAS AERUGINOSA KLEBSIELLA OXYTOCA SUSCEPTIBILITIES PERFORMED ON PREVIOUS CULTURE WITHIN THE LAST 5 DAYS. Performed at [facility], [place] [address], [place], [place] [address]    Report Status 01/15/2019 FINAL  Final  Culture, blood (routine x 2)     Status: Abnormal   Collection Time: 01/11/19  2:10 PM  Result Value Ref Range Status   Specimen Description BLOOD LEFT HAND  Final   Special Requests   Final    BOTTLES DRAWN AEROBIC AND ANAEROBIC Blood Culture adequate volume   Culture  Setup Time   Final    GRAM POSITIVE COCCI ANAEROBIC BOTTLE ONLY GRAM NEGATIVE RODS IN BOTH AEROBIC AND ANAEROBIC BOTTLES CRITICAL RESULT CALLED TO, READ BACK BY AND VERIFIED WITH: Karlene Einstein PharmD 10:15 01/12/19 ([email or web address])    Culture (A)  Final    PSEUDOMONAS AERUGINOSA KLEBSIELLA OXYTOCA STAPHYLOCOCCUS SPECIES (COAGULASE NEGATIVE) THE SIGNIFICANCE OF ISOLATING THIS ORGANISM FROM A SINGLE SET OF BLOOD CULTURES WHEN MULTIPLE SETS ARE DRAWN IS UNCERTAIN. PLEASE NOTIFY THE MICROBIOLOGY DEPARTMENT WITHIN ONE WEEK IF SPECIATION AND SENSITIVITIES ARE REQUIRED. Performed at [facility], [place] [address] [place], [place] [address]    Report Status  01/14/2019 FINAL  Final  Organism ID, Bacteria PSEUDOMONAS AERUGINOSA  Final   Organism ID, Bacteria KLEBSIELLA OXYTOCA  Final      Susceptibility   Klebsiella oxytoca - MIC*    AMPICILLIN >=32 RESISTANT Resistant     CEFAZOLIN >=64 RESISTANT Resistant     CEFEPIME <=1 SENSITIVE Sensitive     CEFTAZIDIME <=1 SENSITIVE Sensitive     CEFTRIAXONE <=1 SENSITIVE Sensitive     CIPROFLOXACIN <=0.25 SENSITIVE Sensitive     GENTAMICIN <=1 SENSITIVE Sensitive     IMIPENEM 0.5 SENSITIVE Sensitive     TRIMETH/SULFA <=20 SENSITIVE Sensitive     AMPICILLIN/SULBACTAM 8 SENSITIVE Sensitive     PIP/TAZO <=4 SENSITIVE Sensitive     Extended ESBL NEGATIVE Sensitive     * KLEBSIELLA OXYTOCA   Pseudomonas aeruginosa - MIC*    CEFTAZIDIME 4 SENSITIVE Sensitive     CIPROFLOXACIN <=0.25 SENSITIVE Sensitive     GENTAMICIN <=1 SENSITIVE Sensitive     IMIPENEM 8 INTERMEDIATE Intermediate     PIP/TAZO 8 SENSITIVE Sensitive     CEFEPIME 2 SENSITIVE Sensitive     * PSEUDOMONAS AERUGINOSA  Blood Culture ID Panel (Reflexed)     Status: Abnormal   Collection Time: 01/11/19  2:10 PM  Result Value Ref Range Status   Enterococcus species NOT DETECTED NOT DETECTED Final   Listeria monocytogenes NOT DETECTED NOT DETECTED Final   Staphylococcus species DETECTED (A) NOT DETECTED Final    Comment: Methicillin (oxacillin) susceptible coagulase negative staphylococcus. Possible blood culture contaminant (unless isolated from more than one blood culture draw or clinical case suggests pathogenicity). No antibiotic treatment is indicated for blood  culture contaminants. CRITICAL RESULT CALLED TO, READ BACK BY AND VERIFIED WITH: Karlene Einstein PharmD 10:15 01/12/19 ([email or web address])    Staphylococcus aureus (BCID) NOT DETECTED NOT DETECTED Final   Methicillin resistance NOT DETECTED NOT DETECTED Final   Streptococcus species NOT DETECTED NOT DETECTED Final   Streptococcus agalactiae NOT DETECTED NOT DETECTED Final   Streptococcus  pneumoniae NOT DETECTED NOT DETECTED Final   Streptococcus pyogenes NOT DETECTED NOT DETECTED Final   Acinetobacter baumannii NOT DETECTED NOT DETECTED Final   Enterobacteriaceae species DETECTED (A) NOT DETECTED Final    Comment: Enterobacteriaceae represent a large family of gram-negative bacteria, not a single organism. CRITICAL RESULT CALLED TO, READ BACK BY AND VERIFIED WITH: Karlene Einstein PharmD 10:15 01/12/19 ([email or web address])    Enterobacter cloacae complex NOT DETECTED NOT DETECTED Final   Escherichia coli NOT DETECTED NOT DETECTED Final   Klebsiella oxytoca DETECTED (A) NOT DETECTED Final    Comment: CRITICAL RESULT CALLED TO, READ BACK BY AND VERIFIED WITH: Karlene Einstein PharmD 10:15 01/12/19 ([email or web address])    Klebsiella pneumoniae NOT DETECTED NOT DETECTED Final   Proteus species NOT DETECTED NOT DETECTED Final   Serratia marcescens NOT DETECTED NOT DETECTED Final   Carbapenem resistance NOT DETECTED NOT DETECTED Final   Haemophilus influenzae NOT DETECTED NOT DETECTED Final   Neisseria meningitidis NOT DETECTED NOT DETECTED Final   Pseudomonas aeruginosa DETECTED (A) NOT DETECTED Final    Comment: CRITICAL RESULT CALLED TO, READ BACK BY AND VERIFIED WITH: Karlene Einstein PharmD 10:15 01/12/19 ([email or web address])    Candida albicans NOT DETECTED NOT DETECTED Final   Candida glabrata NOT DETECTED NOT DETECTED Final   Candida krusei NOT DETECTED NOT DETECTED Final   Candida parapsilosis NOT DETECTED NOT DETECTED Final   Candida tropicalis NOT DETECTED NOT DETECTED Final    Comment: Performed at [facility], [place]. [address], [place], [place] [address]  Respiratory Panel by PCR     Status: None   Collection Time: 01/11/19  3:00 PM  Result Value Ref Range Status   Adenovirus NOT DETECTED NOT DETECTED Final   Coronavirus 229E NOT DETECTED NOT DETECTED Final    Comment: (NOTE) The Coronavirus on the Respiratory Panel, DOES NOT test for the novel  Coronavirus (2019 nCoV)    Coronavirus HKU1 NOT  DETECTED NOT DETECTED Final   Coronavirus NL63 NOT DETECTED NOT DETECTED Final   Coronavirus OC43 NOT DETECTED NOT DETECTED Final   Metapneumovirus NOT DETECTED NOT DETECTED Final   Rhinovirus / Enterovirus NOT DETECTED NOT DETECTED Final   Influenza A NOT DETECTED NOT DETECTED Final   Influenza B NOT DETECTED NOT DETECTED Final   Parainfluenza Virus 1 NOT DETECTED NOT DETECTED Final   Parainfluenza Virus 2 NOT DETECTED NOT DETECTED Final   Parainfluenza Virus 3 NOT DETECTED NOT DETECTED Final   Parainfluenza Virus 4 NOT DETECTED NOT DETECTED Final   Respiratory Syncytial Virus NOT DETECTED NOT DETECTED Final   Bordetella pertussis NOT DETECTED NOT DETECTED Final   Chlamydophila pneumoniae NOT DETECTED NOT DETECTED Final   Mycoplasma pneumoniae NOT DETECTED NOT DETECTED Final    Comment: Performed at [facility], [address] [place], [place] [address]  Urine culture     Status: Abnormal   Collection Time: 01/11/19  8:49 PM  Result Value Ref Range Status   Specimen Description URINE, RANDOM  Final   Special Requests   Final    NONE Performed at [facility], [place] [address], [place], [place] [address]    Culture >=100,000 COLONIES/mL STAPHYLOCOCCUS EPIDERMIDIS (A)  Final   Report Status 01/14/2019 FINAL  Final   Organism ID, Bacteria STAPHYLOCOCCUS EPIDERMIDIS (A)  Final      Susceptibility   Staphylococcus epidermidis - MIC*    CIPROFLOXACIN <=0.5 SENSITIVE Sensitive     GENTAMICIN <=0.5 SENSITIVE Sensitive     NITROFURANTOIN <=16 SENSITIVE Sensitive     OXACILLIN <=0.25 SENSITIVE Sensitive     TETRACYCLINE 2 SENSITIVE Sensitive     VANCOMYCIN <=0.5 SENSITIVE Sensitive     TRIMETH/SULFA 160 RESISTANT Resistant     CLINDAMYCIN <=0.25 SENSITIVE Sensitive     RIFAMPIN <=0.5 SENSITIVE Sensitive     Inducible Clindamycin NEGATIVE Sensitive     * >=100,000 COLONIES/mL STAPHYLOCOCCUS EPIDERMIDIS         Radiology Studies: [place] Ekg Site Rite  Result Date:  01/14/2019 If Site Rite image not attached, placement could not be confirmed due to current cardiac rhythm.       Scheduled Meds: . acetaminophen  1,000 mg Oral QHS  . apixaban  2.5 mg Oral BID  . cycloSPORINE  1 drop Both Eyes BID  . donepezil  5 mg Oral Daily  . guaiFENesin  600 mg Oral BID  . metoprolol tartrate  100 mg Oral BID  . valACYclovir  500 mg Oral Daily   Continuous Infusions: . ceFEPime (MAXIPIME) IV 2 g (01/15/19 1038)     LOS: 4 days    Time spent: 25 minutes    Edwin Dada, MD [facility] 01/15/2019, 5:02 PM     Please page through [place]:  [email or web address] Password [email or web address] If 7PM-7AM, please contact night-coverage

## 2019-01-15 NOTE — Plan of Care (Signed)
  Problem: Education: Goal: Knowledge of General Education information will improve Description Including pain rating scale, medication(s)/side effects and non-pharmacologic comfort measures Exit care note Metoprolol given to patient for daughter to read   Outcome: Progressing   Problem: Health Behavior/Discharge Planning: Goal: Ability to manage health-related needs will improve Outcome: Progressing   Problem: Clinical Measurements: Goal: Ability to maintain clinical measurements within normal limits will improve Outcome: Progressing Goal: Will remain free from infection Outcome: Progressing Goal: Diagnostic test results will improve Outcome: Progressing Goal: Respiratory complications will improve Outcome: Progressing Goal: Cardiovascular complication will be avoided Outcome: Progressing

## 2019-01-15 NOTE — Social Work (Signed)
CSW called pt daughter Baxter Flattery and spoke with her about d/c tomorrow. Provided her with [facility] admissions liaison number to coordinate paperwork completion.  Aware pt getting picc placed today.  Westley Hummer, MSW, [place] Work [phone]

## 2019-01-15 NOTE — Progress Notes (Signed)
Peripherally Inserted Central Catheter/Midline Placement  The IV Nurse has discussed with the patient and/or persons authorized to consent for the patient, the purpose of this procedure and the potential benefits and risks involved with this procedure.  The benefits include less needle sticks, lab draws from the catheter, and the patient may be discharged home with the catheter. Risks include, but not limited to, infection, bleeding, blood clot (thrombus formation), and puncture of an artery; nerve damage and irregular heartbeat and possibility to perform a PICC exchange if needed/ordered by physician.  Alternatives to this procedure were also discussed.  B[employer] PICC patient education guide, fact sheet on infection prevention and patient information card has been provided to patient /or left at bedside.    PICC/Midline Placement Documentation  PICC Single Lumen 01/15/19 PICC Right Brachial 35 cm 0 cm (Active)  Indication for Insertion or Continuance of Line Home intravenous therapies (PICC only) 01/15/2019  5:00 PM  Exposed Catheter (cm) 0 cm 01/15/2019  5:00 PM  Site Assessment Clean;Dry;Intact 01/15/2019  5:00 PM  Line Status Flushed;Blood return noted 01/15/2019  5:00 PM  Dressing Type Transparent 01/15/2019  5:00 PM  Dressing Status Clean;Dry;Intact;Antimicrobial disc in place 01/15/2019  5:00 PM  Dressing Change Due 01/22/19 01/15/2019  5:00 PM       Jule Economy Horton 01/15/2019, 5:12 PM

## 2019-01-15 NOTE — Care Management Important Message (Signed)
Important Message  Patient Details  Name: ANGELICIA LESSNER MRN: [id number] Date of Birth: Sep 04, 1930   Medicare Important Message Given:  Yes    Zyron Deeley 01/15/2019, 2:01 PM

## 2019-01-15 NOTE — Clinical Social Work Placement (Signed)
   CLINICAL SOCIAL WORK PLACEMENT  NOTE [facility] d/c on 01/16/2019 RN to call report to [phone]  Date:  01/15/2019  Patient Details  Name: Amanda Hinton MRN: [id number] Date of Birth: 1930/03/17  Clinical Social Work is seeking post-discharge placement for this patient at the [place] level of care (*CSW will initial, date and re-position this form in  chart as items are completed):  Yes   Patient/family provided with [facility] Work Department's list of facilities offering this level of care within the geographic area requested by the patient (or if unable, by the patient's family).  Yes   Patient/family informed of their freedom to choose among providers that offer the needed level of care, that participate in Medicare, Medicaid or managed care program needed by the patient, have an available bed and are willing to accept the patient.  Yes   Patient/family informed of [facility]'s ownership interest in [facility] and [facility], as well as of the fact that they are under no obligation to receive care at these facilities.  PASRR submitted to EDS on       PASRR number received on 01/14/19     Existing PASRR number confirmed on       FL2 transmitted to all facilities in geographic area requested by pt/family on 01/14/19     FL2 transmitted to all facilities within larger geographic area on       Patient informed that his/her managed care company has contracts with or will negotiate with certain facilities, including the following:        Yes   Patient/family informed of bed offers received.  Patient chooses bed at [facility]     Physician recommends and patient chooses bed at      Patient to be transferred to [facility] on 01/16/19.  Patient to be transferred to facility by [facility]     Patient family notified on   of transfer.  Name of family member notified:  pt daughter Baxter Flattery      PHYSICIAN Please prepare priority discharge summary, including medications, Please prepare prescriptions     Additional Comment:    _______________________________________________ Alexander Mt, LCSWA 01/15/2019, 4:17 PM

## 2019-01-15 NOTE — Progress Notes (Signed)
Pharmacy Antibiotic Note  Amanda Hinton is a 85 y.o. female admitted on 01/11/2019 with sepsis.  Pharmacy has been consulted for cefepime dosing.  Day #5 of abx for pseudomonas, kleb oxytoca, and staph species in blood cx.  Plan: Cefepime 2g IV Q12h for more aggressive dosing since treating pseudomonas bacteremia Plan for 14 days of treatment  Height: 5\' 6"  (167.6 cm) Weight: 130 lb (59 kg) IBW/kg (Calculated) : 59.3  Temp (24hrs), Avg:98.2 F (36.8 C), Min:98 F (36.7 C), Max:98.4 F (36.9 C)  Recent Labs  Lab 01/11/19 1350 01/11/19 1400 01/12/19 0334 01/13/19 0217 01/14/19 0347 01/15/19 0124  WBC 19.8*  --  23.0* 18.8* 16.6* 12.8*  CREATININE 0.99  --  0.85 0.83 0.86 0.88  LATICACIDVEN  --  1.3  --   --   --   --     Estimated Creatinine Clearance: 41.9 mL/min (by C-G formula based on SCr of 0.88 mg/dL).    Allergies  Allergen Reactions  . Avelox [Moxifloxacin Hcl In Nacl] Other (See Comments)    Prolonged QT wave  . Prednisone Other (See Comments)    REACTION: Disoriented    Thank you for allowing pharmacy to be a part of this patient's care.  Reginia Naas 01/15/2019 1:45 PM

## 2019-01-15 NOTE — Progress Notes (Signed)
OT Cancellation Note  Patient Details Name: Amanda Hinton MRN: [id number] DOB: 1930/02/28   Cancelled Treatment:    Reason Eval/Treat Not Completed: Patient declined, no reason specified(Pt politely declined many times today. OT to follow-up PRN.)   Darryl Nestle) Marsa Aris OTR/L Acute Rehabilitation Services Pager: [phone] Office: [phone]  Fredda Hammed 01/15/2019, 3:30 PM

## 2019-01-16 DIAGNOSIS — G9341 Metabolic encephalopathy: Secondary | ICD-10-CM | POA: Diagnosis not present

## 2019-01-16 DIAGNOSIS — Z1639 Resistance to other specified antimicrobial drug: Secondary | ICD-10-CM | POA: Diagnosis not present

## 2019-01-16 DIAGNOSIS — I480 Paroxysmal atrial fibrillation: Secondary | ICD-10-CM | POA: Diagnosis not present

## 2019-01-16 DIAGNOSIS — G9340 Encephalopathy, unspecified: Secondary | ICD-10-CM | POA: Diagnosis not present

## 2019-01-16 DIAGNOSIS — R1312 Dysphagia, oropharyngeal phase: Secondary | ICD-10-CM | POA: Diagnosis not present

## 2019-01-16 DIAGNOSIS — J9601 Acute respiratory failure with hypoxia: Secondary | ICD-10-CM | POA: Diagnosis not present

## 2019-01-16 DIAGNOSIS — H04123 Dry eye syndrome of bilateral lacrimal glands: Secondary | ICD-10-CM | POA: Diagnosis not present

## 2019-01-16 DIAGNOSIS — J189 Pneumonia, unspecified organism: Secondary | ICD-10-CM | POA: Diagnosis not present

## 2019-01-16 DIAGNOSIS — I10 Essential (primary) hypertension: Secondary | ICD-10-CM | POA: Diagnosis not present

## 2019-01-16 DIAGNOSIS — M2550 Pain in unspecified joint: Secondary | ICD-10-CM | POA: Diagnosis not present

## 2019-01-16 DIAGNOSIS — F0280 Dementia in other diseases classified elsewhere without behavioral disturbance: Secondary | ICD-10-CM | POA: Diagnosis not present

## 2019-01-16 DIAGNOSIS — I4891 Unspecified atrial fibrillation: Secondary | ICD-10-CM | POA: Diagnosis not present

## 2019-01-16 DIAGNOSIS — G301 Alzheimer's disease with late onset: Secondary | ICD-10-CM | POA: Diagnosis not present

## 2019-01-16 DIAGNOSIS — E785 Hyperlipidemia, unspecified: Secondary | ICD-10-CM | POA: Diagnosis not present

## 2019-01-16 DIAGNOSIS — R2681 Unsteadiness on feet: Secondary | ICD-10-CM | POA: Diagnosis not present

## 2019-01-16 DIAGNOSIS — Z1611 Resistance to penicillins: Secondary | ICD-10-CM | POA: Diagnosis not present

## 2019-01-16 DIAGNOSIS — A4152 Sepsis due to Pseudomonas: Secondary | ICD-10-CM | POA: Diagnosis not present

## 2019-01-16 DIAGNOSIS — R41841 Cognitive communication deficit: Secondary | ICD-10-CM | POA: Diagnosis not present

## 2019-01-16 DIAGNOSIS — J449 Chronic obstructive pulmonary disease, unspecified: Secondary | ICD-10-CM | POA: Diagnosis not present

## 2019-01-16 DIAGNOSIS — M6281 Muscle weakness (generalized): Secondary | ICD-10-CM | POA: Diagnosis not present

## 2019-01-16 DIAGNOSIS — D649 Anemia, unspecified: Secondary | ICD-10-CM | POA: Diagnosis not present

## 2019-01-16 DIAGNOSIS — R2689 Other abnormalities of gait and mobility: Secondary | ICD-10-CM | POA: Diagnosis not present

## 2019-01-16 DIAGNOSIS — R6520 Severe sepsis without septic shock: Secondary | ICD-10-CM | POA: Diagnosis not present

## 2019-01-16 DIAGNOSIS — I69828 Other speech and language deficits following other cerebrovascular disease: Secondary | ICD-10-CM | POA: Diagnosis not present

## 2019-01-16 DIAGNOSIS — A414 Sepsis due to anaerobes: Secondary | ICD-10-CM | POA: Diagnosis not present

## 2019-01-16 DIAGNOSIS — Z7401 Bed confinement status: Secondary | ICD-10-CM | POA: Diagnosis not present

## 2019-01-16 LAB — BASIC METABOLIC PANEL
Anion gap: 6 (ref 5–15)
BUN: 20 mg/dL (ref 8–23)
CO2: 23 mmol/L (ref 22–32)
Calcium: 8.2 mg/dL — ABNORMAL LOW (ref 8.9–10.3)
Chloride: 108 mmol/L (ref 98–111)
Creatinine, Ser: 0.91 mg/dL (ref 0.44–1.00)
GFR calc Af Amer: >60 mL/min (ref >60)
GFR calc non Af Amer: 57 mL/min — ABNORMAL LOW (ref >60)
Glucose, Bld: 95 mg/dL (ref 70–99)
POTASSIUM: 3.9 mmol/L (ref 3.5–5.1)
Sodium: 137 mmol/L (ref 135–145)

## 2019-01-16 LAB — CBC
HCT: 34.4 % — ABNORMAL LOW (ref 36.0–46.0)
Hemoglobin: 10.8 g/dL — ABNORMAL LOW (ref 12.0–15.0)
MCH: 27.3 pg (ref 26.0–34.0)
MCHC: 31.4 g/dL (ref 30.0–36.0)
MCV: 87.1 fL (ref 80.0–100.0)
Platelets: 319 10*3/uL (ref 150–400)
RBC: 3.95 MIL/uL (ref 3.87–5.11)
RDW: 14.6 % (ref 11.5–15.5)
WBC: 11.4 10*3/uL — AB (ref 4.0–10.5)
nRBC: 0 % (ref 0.0–0.2)

## 2019-01-16 MED ORDER — DILTIAZEM HCL ER COATED BEADS 120 MG PO CP24: 120.0000 mg | ORAL_CAPSULE | Freq: Every evening | ORAL | 0 refills | Status: DC

## 2019-01-16 MED ORDER — CEFEPIME IV (FOR PTA / DISCHARGE USE ONLY): 2.0000 g | Freq: Two times a day (BID) | INTRAVENOUS | 0 refills | Status: AC

## 2019-01-16 MED ORDER — METOPROLOL TARTRATE 100 MG PO TABS: 100.0000 mg | ORAL_TABLET | Freq: Two times a day (BID) | ORAL | 0 refills | Status: DC

## 2019-01-16 MED ORDER — DILTIAZEM HCL 60 MG PO TABS
30.0000 mg | ORAL_TABLET | Freq: Four times a day (QID) | ORAL | Status: DC
  Administered 2019-01-16: 30 mg via ORAL
  Filled 2019-01-16: qty 0.5

## 2019-01-16 MED ORDER — ACETAMINOPHEN 325 MG PO TABS: 650.0000 mg | ORAL_TABLET | Freq: Four times a day (QID) | ORAL | Status: AC | PRN

## 2019-01-16 MED ORDER — HEPARIN SOD (PORK) LOCK FLUSH 100 UNIT/ML IV SOLN
250.0000 [IU] | INTRAVENOUS | Status: AC | PRN
  Administered 2019-01-16: 250 [IU]

## 2019-01-16 MED ORDER — APIXABAN 2.5 MG PO TABS: 2.5000 mg | ORAL_TABLET | Freq: Two times a day (BID) | ORAL | 0 refills | Status: DC

## 2019-01-16 MED ORDER — ACETAMINOPHEN 500 MG PO TABS: 1000.0000 mg | ORAL_TABLET | Freq: Every day | ORAL | 0 refills | Status: AC

## 2019-01-16 NOTE — Discharge Summary (Signed)
Physician Discharge Summary  Amanda Hinton:[id number] DOB: 01/14/1930 DOA: 01/11/2019  PCP: Lawerance Cruel, MD  Admit date: 01/11/2019 Discharge date: 01/16/2019  Admitted From: Home  Disposition:  SNF   Recommendations for Outpatient Follow-up:  1. Follow up with PCP in 1-2 weeks after discharge from SNF 2. Please obtain CBC and BMP in 1 month 3. Please measure heart rate daily at SNF --> if HR >90, increase dose of diltiazem CD 4. Continue Cefepime q12 hrs until Mar 8th, then remove PICC  5. Primary care doctor: please refer for outpatient echocardiogram given new onset atrial fibrillation     Home Health: N/A  Equipment/Devices: TBD at SNF  Discharge Condition: Fair  CODE STATUS: FULL Diet recommendation: Regular  Brief/Interim Summary: Amanda Hinton is a 85 y.o. F with dementia, community-dwelling, AAA 3.2cm in surveillance, and COPD no O2 who presented with fever, malaise.  In ER, CXR clear, UA suggestive of infection.  CT abdomen unremarkable. Cultures obtained and started on broad spectrum antibiotics for sepsis.  Blood cultures subsequently growing Klebsiella and Pseudomonas.     PRINCIPAL HOSPITAL DIAGNOSIS: Sepsis from pseudomonas and klebsiella bacteremia complicated by new onset atrial fibrillation    Discharge Diagnoses:   Sepsis from Pseudmonas and Klebsiella bacteremia Admitted with tachycardia, leukocytosis, confusion.  Suspected source ultimately unclear, possibly urine, although urine culture not concordant.  Blood cultures with simultaneous Klebsiella and Pseudomonas.  CXR with minor right upper lobe opacity.     -Continue cefepime, needs total 14 days from Feb 24   Delirium Dementia Resolved, patient back to baseline dementia. -Acetaminophen 1000 mg nightly started  COPD No active disease  Hypoxia Presented with respiratory rate 26, new hypoxia 89% requiring supplemental O2.  Presented with Likely from sepsis.  Resolved to  room air, no dyspnea by discharge.  New onset atrial fibrillation CHA2DS2-Vasc 4 from age, gender, PVD (family deny any history of HTN).  No prior stroke.  HASBLED 1, low risk.   TSH normal, Mag normal.  Rate <110 with metoprolol and asymptomatic.    -Continue Eliquis, 2.5 dose given age, weight -Continue metoprolol 100 twice daily and diltiazem CD 120 daily -Monitor HR and titrate diltiazem to achieve HR <90 -Obtain outpatient echo    Mild anemia Possibly dilutional.  Normocytic.  No clinical bleeding. -Follow up CBC In 1 month after d/c            Discharge Instructions  Discharge Instructions    Diet - low sodium heart healthy   Complete by:  As directed    Discharge instructions   Complete by:  As directed    From Dr. Loleta Books: You were admitted for a severe infection in the bloodstream with Pseudomonas and Klebsiella. You were treated with broad spectrum antibiotics. You will continue with Cefepime 2g through the IV three times a day until March 8th Please remove PICC line IV at the end of therapy.   YOu were also found to have new onset atrial fibrillation You were started on a blood thinner, apixaban/Eliquis Take apixaban 2.5 mg twice daily from now on If you notice blood in your stool or black and tarry stools, this may be a sign of bleeding, stop the apixaban and call your doctor immediately Tell all your healthcare providers that you take a blood thinner  You were also started on diltiazem and metoprolol  The nursing home should check your heart rate daily They should increase your diltiazem if your heart rate is above 90 bpm  Follow up with your primary care doctor after you get home from rehab   Home infusion instructions [place] May follow [place] Dosing Protocol; May administer Cathflo as needed to maintain patency of vascular access device.; Flushing of vascular access device: per [facility] Protocol: 0.9% NaCl pre/post medica...   Complete  by:  As directed    RN may remove PICC at the end of therapy.   Instructions:  May follow [place] Dosing Protocol   Instructions:  May administer Cathflo as needed to maintain patency of vascular access device.   Instructions:  Flushing of vascular access device: per [facility] Protocol: 0.9% NaCl pre/post medication administration and prn patency; Heparin 100 u/ml, 2m for implanted ports and Heparin 10u/ml, 5105mfor all other central venous catheters.   Instructions:  May follow [facility] Anaphylaxis Protocol for First Dose Administration in the home: 0.9% NaCl at 25-50 ml/hr to maintain IV access for protocol meds. Epinephrine 0.3 ml IV/IM PRN and Benadryl 25-50 IV/IM PRN s/s of anaphylaxis.   Instructions:  [facility] Coordinator (RN) to assist per patient IV care needs in the home PRN.   Increase activity slowly   Complete by:  As directed      Allergies as of 01/16/2019      Reactions   Avelox [moxifloxacin Hcl In Nacl] Other (See Comments)   Prolonged QT wave   Prednisone Other (See Comments)   REACTION: Disoriented      Medication List    STOP taking these medications   Ginkgo 60 MG Tabs   umeclidinium bromide 62.5 MCG/INH Aepb Commonly known as:  INCRUSE ELLIPTA     TAKE these medications   acetaminophen 500 MG tablet Commonly known as:  TYLENOL Take 2 tablets (1,000 mg total) by mouth at bedtime.   acetaminophen 325 MG tablet Commonly known as:  TYLENOL Take 2 tablets (650 mg total) by mouth every 6 (six) hours as needed for mild pain (or Fever >/= 101).   apixaban 2.5 MG Tabs tablet Commonly known as:  ELIQUIS Take 1 tablet (2.5 mg total) by mouth 2 (two) times daily.   aspirin 81 MG tablet Take 81 mg by mouth daily.   b complex vitamins tablet Take 1 tablet by mouth daily.   ceFEPime  IVPB Commonly known as:  MAXIPIME Inject 2 g into the vein every 12 (twelve) hours for 8 days. Indication:  Bloodstream infection Last Day of Therapy:  01/25/19 Labs  - Once weekly:  CBC/D and BMP, Labs - Every other week:  ESR and CRP   cycloSPORINE 0.05 % ophthalmic emulsion Commonly known as:  RESTASIS Place 1 drop into both eyes 2 (two) times daily.   diltiazem 120 MG 24 hr capsule Commonly known as:  CARDIZEM CD Take 1 capsule (120 mg total) by mouth every evening for 30 days.   docusate sodium 100 MG capsule Commonly known as:  COLACE Take 100 mg by mouth daily as needed for mild constipation.   donepezil 5 MG tablet Commonly known as:  ARICEPT Take 5 mg by mouth daily.   KLOR-CON PO Take 1 tablet by mouth daily.   metoprolol tartrate 100 MG tablet Commonly known as:  LOPRESSOR Take 1 tablet (100 mg total) by mouth 2 (two) times daily.   multivitamin with minerals tablet Take 1 tablet by mouth daily.   polyethylene glycol packet Commonly known as:  MIRALAX / GLYCOLAX Take 17 g by mouth daily as needed for mild constipation.  valACYclovir 500 MG tablet Commonly known as:  VALTREX Take 500 mg by mouth daily.            Home Infusion Instuctions  (From admission, onward)         Start     Ordered   01/16/19 0000  Home infusion instructions [facility] May follow [place] Dosing Protocol; May administer Cathflo as needed to maintain patency of vascular access device.; Flushing of vascular access device: per [facility] Protocol: 0.9% NaCl pre/post medica...    Comments:  RN may remove PICC at the end of therapy.  Question Answer Comment  Instructions May follow [place] Dosing Protocol   Instructions May administer Cathflo as needed to maintain patency of vascular access device.   Instructions Flushing of vascular access device: per [facility] Protocol: 0.9% NaCl pre/post medication administration and prn patency; Heparin 100 u/ml, 71m for implanted ports and Heparin 10u/ml, 553mfor all other central venous catheters.   Instructions May follow [facility] Anaphylaxis Protocol for First Dose Administration in the home: 0.9% NaCl at  25-50 ml/hr to maintain IV access for protocol meds. Epinephrine 0.3 ml IV/IM PRN and Benadryl 25-50 IV/IM PRN s/s of anaphylaxis.   Instructions [facility] Infusion Coordinator (RN) to assist per patient IV care needs in the home PRN.      01/16/19 1344           Durable Medical Equipment  (From admission, onward)         Start     Ordered   01/13/19 1022  For home use only DME 3 n 1  Once     01/13/19 1021         Contact information for after-discharge care    Destination    [facility] Preferred SNF .   Service:  Skilled Nursing Contact information: [phone]           Allergies  Allergen Reactions  . Avelox [Moxifloxacin Hcl In Nacl] Other (See Comments)    Prolonged QT wave  . Prednisone Other (See Comments)    REACTION: Disoriented    Consultations:  Infectious disease   Procedures/Studies: Dg Chest 2 View  Result Date: 01/11/2019 CLINICAL DATA:  Altered mental status, incontinence and possible pneumonia. EXAM: CHEST - 2 VIEW COMPARISON:  11/07/2015 FINDINGS: The heart size is at the upper limits of normal. There may be a subtle right upper lobe infiltrate. Chronic lung disease present with potential mild interstitial edema. No pleural effusions or evidence of pneumothorax. IMPRESSION: Possible subtle right upper lobe infiltrate. Chronic lung disease with potential mild interstitial edema. Electronically Signed   By: GlAletta Edouard.D.   On: 01/11/2019 16:14   Ct Abdomen Pelvis W Contrast  Result Date: 01/11/2019 CLINICAL DATA:  [age]ear old female with confusion. Fever. Known aneurysm which is stable. Prior appendectomy. Initial encounter. EXAM: CT ABDOMEN AND PELVIS WITH CONTRAST TECHNIQUE: Multidetector CT imaging of the abdomen and pelvis was performed using the standard protocol following bolus administration of intravenous contrast. CONTRAST:  10036mMNIPAQUE IOHEXOL  300 MG/ML  SOLN COMPARISON:  Pelvic CT 07/07/2018. 06/16/2017 abdominal aortic ultrasound. FINDINGS: Lower chest: Basilar parenchymal changes suggestive of scarring/atelectasis. Peribronchial thickening left lower lobe may represent chronic changes although can not exclude component of bronchitis. Left base small calcified granuloma. Cardiomegaly. Mitral valve calcification. Coronary artery calcification. Hepatobiliary: Motion degraded exam. No worrisome hepatic lesion. No calcified gallstones. Limited for evaluating for  gallbladder inflammation secondary to motion artifact. Pancreas: Motion degraded exam. No worrisome pancreatic mass or inflammation noted. Spleen: Motion degraded exam causes artifact near the hilum. Adrenals/Urinary Tract: Motion degraded exam. No obstructing stone or hydronephrosis. No worrisome renal mass. Right lower pole 7 mm nonobstructing stone. No worrisome adrenal lesion. Noncontrast filled views of the urinary bladder unremarkable. Stomach/Bowel: Significant sigmoid diverticula and muscular hypertrophy. Slightly engorged vessels within the sigmoid mesentery but without discrete evidence of diverticulitis. Other portions of bowel are limited by motion degradation however, no extraluminal bowel inflammatory process noted. Moderate-size hiatal hernia. Partially fluid-filled normal size small bowel loops. Vascular/Lymphatic: Prominent atherosclerotic changes abdominal aorta and aortic branch vessels. Infrarenal abdominal aortic aneurysm measuring up to 3.2 cm versus 3 cm on prior ultrasound. Prominent narrowing origin of the celiac artery, superior mesenteric artery, inferior mesenteric artery and renal arteries. Significant narrowing iliac arteries. Scattered normal size lymph nodes. Reproductive: No worrisome uterine or adnexal mass. Other: No free intraperitoneal air or bowel containing hernia. Musculoskeletal: Remote left pubic body fracture with incomplete healing. Remote sacral  fractures. Prominent scoliosis lower thoracic and lumbar spine convex left with superimposed degenerative changes. IMPRESSION: 1. Exam is motion degraded which limits evaluation for detection of an inflammatory process (particularly at the level of the gallbladder and upper abdomen). Taking this limitation into account, no discrete inflammatory process is noted. 2. Significant sigmoid diverticulosis with muscular hypertrophy. Slightly engorged vessels within the sigmoid mesentery but without discrete findings of diverticulitis. 3. Moderate size hiatal hernia. 4. Peribronchial thickening left lower lobe may represent chronic changes although can not exclude component of bronchitis. Basilar atelectasis/scarring. 5. Cardiomegaly.  Coronary artery calcification. 6. Aortic Atherosclerosis (ICD10-I70.0). Abdominal aortic aneurysm measures 3.2 cm versus prior 3 cm. Recommend followup by ultrasound in 3 years. This recommendation follows ACR consensus guidelines: White Paper of the ACR Incidental Findings Committee II on Vascular Findings. J Am Coll Radiol 2013; 29:528-413 7. Prominent narrowing origin of the celiac artery, superior mesenteric artery, inferior mesenteric artery and renal arteries. Significant narrowing iliac arteries. 8. Remote left pubic body fracture with incomplete healing. Remote sacral fractures. 9. Prominent scoliosis lumbar spine convex left with superimposed degenerative changes. Electronically Signed   By: Genia Del M.D.   On: 01/11/2019 17:31   Dg Chest Port 1v Same Day  Result Date: 01/15/2019 CLINICAL DATA:  Status post PICC placement. EXAM: PORTABLE CHEST 1 VIEW COMPARISON:  01/11/2019 FINDINGS: A right PICC has been placed and terminates over the mid to lower SVC. The cardiac silhouette is mildly enlarged. The interstitial markings remain increased diffusely. Patchy right midlung opacity on the prior study has largely resolved. No pleural effusion or pneumothorax is identified.  IMPRESSION: 1. Right PICC terminates over the mid to lower SVC. 2. Persistent increased interstitial markings diffusely which may reflect chronic lung disease with possible superimposed edema or atypical infection. 3. Largely resolved right midlung infiltrate. Electronically Signed   By: Logan Bores M.D.   On: 01/15/2019 17:58   [place] Ekg Site Rite  Result Date: 01/14/2019 If [facility] image not attached, placement could not be confirmed due to current cardiac rhythm.      Subjective: No chest pain, dyspnea, palpitations. No confusion, fever.  No cough, sputum.  Discharge Exam:  01/15/19 2117 01/16/19 0604  BP: (!) 131/95 127/74  Pulse: (!) 109 (!) 102  Resp: 18 17  Temp: 98.4 F (36.9 C) 98.2 F (36.8 C)  TempSrc: Oral Oral  SpO2: 95% 94%  Weight:    Height:  General: Pt is alert, awake, not in acute distress, sitting in recliner Cardiovascular: RRR, nl S1-S2, no murmurs appreciated.   No LE edema.   Respiratory: Normal respiratory rate and rhythm.  CTAB without rales or wheezes. Abdominal: Abdomen soft and non-tender.  No distension or HSM.   Neuro/Psych: Strength symmetric in upper and lower extremities.  Judgment and insight appear normal.   The results of significant diagnostics from this hospitalization (including imaging, microbiology, ancillary and laboratory) are listed below for reference.     Microbiology: Recent Results (from the past 240 hour(s))  Culture, blood (routine x 2)     Status: Abnormal   Collection Time: 01/11/19  2:05 PM  Result Value Ref Range Status   Specimen Description BLOOD RIGHT ANTECUBITAL  Final   Special Requests   Final    BOTTLES DRAWN AEROBIC AND ANAEROBIC Blood Culture adequate volume   Culture  Setup Time   Final    GRAM NEGATIVE RODS IN BOTH AEROBIC AND ANAEROBIC BOTTLES CRITICAL VALUE NOTED.  VALUE IS CONSISTENT WITH PREVIOUSLY REPORTED AND CALLED VALUE.    Culture (A)  Final    PSEUDOMONAS AERUGINOSA KLEBSIELLA  OXYTOCA SUSCEPTIBILITIES PERFORMED ON PREVIOUS CULTURE WITHIN THE LAST 5 DAYS. Performed at [facility], [place] [address], [place], [place] [address]    Report Status 01/15/2019 FINAL  Final  Culture, blood (routine x 2)     Status: Abnormal   Collection Time: 01/11/19  2:10 PM  Result Value Ref Range Status   Specimen Description BLOOD LEFT HAND  Final   Special Requests   Final    BOTTLES DRAWN AEROBIC AND ANAEROBIC Blood Culture adequate volume   Culture  Setup Time   Final    GRAM POSITIVE COCCI ANAEROBIC BOTTLE ONLY GRAM NEGATIVE RODS IN BOTH AEROBIC AND ANAEROBIC BOTTLES CRITICAL RESULT CALLED TO, READ BACK BY AND VERIFIED WITH: Karlene Einstein PharmD 10:15 01/12/19 ([email or web address])    Culture (A)  Final    PSEUDOMONAS AERUGINOSA KLEBSIELLA OXYTOCA STAPHYLOCOCCUS SPECIES (COAGULASE NEGATIVE) THE SIGNIFICANCE OF ISOLATING THIS ORGANISM FROM A SINGLE SET OF BLOOD CULTURES WHEN MULTIPLE SETS ARE DRAWN IS UNCERTAIN. PLEASE NOTIFY THE MICROBIOLOGY DEPARTMENT WITHIN ONE WEEK IF SPECIATION AND SENSITIVITIES ARE REQUIRED. Performed at [facility], [place] [address] [place], [place] [address]    Report Status 01/14/2019 FINAL  Final   Organism ID, Bacteria PSEUDOMONAS AERUGINOSA  Final   Organism ID, Bacteria KLEBSIELLA OXYTOCA  Final      Susceptibility   Klebsiella oxytoca - MIC*    AMPICILLIN >=32 RESISTANT Resistant     CEFAZOLIN >=64 RESISTANT Resistant     CEFEPIME <=1 SENSITIVE Sensitive     CEFTAZIDIME <=1 SENSITIVE Sensitive     CEFTRIAXONE <=1 SENSITIVE Sensitive     CIPROFLOXACIN <=0.25 SENSITIVE Sensitive     GENTAMICIN <=1 SENSITIVE Sensitive     IMIPENEM 0.5 SENSITIVE Sensitive     TRIMETH/SULFA <=20 SENSITIVE Sensitive     AMPICILLIN/SULBACTAM 8 SENSITIVE Sensitive     PIP/TAZO <=4 SENSITIVE Sensitive     Extended ESBL NEGATIVE Sensitive     * KLEBSIELLA OXYTOCA   Pseudomonas aeruginosa - MIC*    CEFTAZIDIME 4 SENSITIVE Sensitive     CIPROFLOXACIN <=0.25  SENSITIVE Sensitive     GENTAMICIN <=1 SENSITIVE Sensitive     IMIPENEM 8 INTERMEDIATE Intermediate     PIP/TAZO 8 SENSITIVE Sensitive     CEFEPIME 2 SENSITIVE Sensitive     * PSEUDOMONAS AERUGINOSA  Blood Culture ID  Panel (Reflexed)     Status: Abnormal   Collection Time: 01/11/19  2:10 PM  Result Value Ref Range Status   Enterococcus species NOT DETECTED NOT DETECTED Final   Listeria monocytogenes NOT DETECTED NOT DETECTED Final   Staphylococcus species DETECTED (A) NOT DETECTED Final    Comment: Methicillin (oxacillin) susceptible coagulase negative staphylococcus. Possible blood culture contaminant (unless isolated from more than one blood culture draw or clinical case suggests pathogenicity). No antibiotic treatment is indicated for blood  culture contaminants. CRITICAL RESULT CALLED TO, READ BACK BY AND VERIFIED WITH: Karlene Einstein PharmD 10:15 01/12/19 ([email or web address])    Staphylococcus aureus (BCID) NOT DETECTED NOT DETECTED Final   Methicillin resistance NOT DETECTED NOT DETECTED Final   Streptococcus species NOT DETECTED NOT DETECTED Final   Streptococcus agalactiae NOT DETECTED NOT DETECTED Final   Streptococcus pneumoniae NOT DETECTED NOT DETECTED Final   Streptococcus pyogenes NOT DETECTED NOT DETECTED Final   Acinetobacter baumannii NOT DETECTED NOT DETECTED Final   Enterobacteriaceae species DETECTED (A) NOT DETECTED Final    Comment: Enterobacteriaceae represent a large family of gram-negative bacteria, not a single organism. CRITICAL RESULT CALLED TO, READ BACK BY AND VERIFIED WITH: Karlene Einstein PharmD 10:15 01/12/19 ([email or web address])    Enterobacter cloacae complex NOT DETECTED NOT DETECTED Final   Escherichia coli NOT DETECTED NOT DETECTED Final   Klebsiella oxytoca DETECTED (A) NOT DETECTED Final    Comment: CRITICAL RESULT CALLED TO, READ BACK BY AND VERIFIED WITH: Karlene Einstein PharmD 10:15 01/12/19 ([email or web address])    Klebsiella pneumoniae NOT DETECTED NOT DETECTED Final   Proteus  species NOT DETECTED NOT DETECTED Final   Serratia marcescens NOT DETECTED NOT DETECTED Final   Carbapenem resistance NOT DETECTED NOT DETECTED Final   Haemophilus influenzae NOT DETECTED NOT DETECTED Final   Neisseria meningitidis NOT DETECTED NOT DETECTED Final   Pseudomonas aeruginosa DETECTED (A) NOT DETECTED Final    Comment: CRITICAL RESULT CALLED TO, READ BACK BY AND VERIFIED WITH: Karlene Einstein PharmD 10:15 01/12/19 ([email or web address])    Candida albicans NOT DETECTED NOT DETECTED Final   Candida glabrata NOT DETECTED NOT DETECTED Final   Candida krusei NOT DETECTED NOT DETECTED Final   Candida parapsilosis NOT DETECTED NOT DETECTED Final   Candida tropicalis NOT DETECTED NOT DETECTED Final    Comment: Performed at [facility], [place]. [address] [place], [place] [address]  Respiratory Panel by PCR     Status: None   Collection Time: 01/11/19  3:00 PM  Result Value Ref Range Status   Adenovirus NOT DETECTED NOT DETECTED Final   Coronavirus 229E NOT DETECTED NOT DETECTED Final    Comment: (NOTE) The Coronavirus on the Respiratory Panel, DOES NOT test for the novel  Coronavirus (2019 nCoV)    Coronavirus HKU1 NOT DETECTED NOT DETECTED Final   Coronavirus NL63 NOT DETECTED NOT DETECTED Final   Coronavirus OC43 NOT DETECTED NOT DETECTED Final   Metapneumovirus NOT DETECTED NOT DETECTED Final   Rhinovirus / Enterovirus NOT DETECTED NOT DETECTED Final   Influenza A NOT DETECTED NOT DETECTED Final   Influenza B NOT DETECTED NOT DETECTED Final   Parainfluenza Virus 1 NOT DETECTED NOT DETECTED Final   Parainfluenza Virus 2 NOT DETECTED NOT DETECTED Final   Parainfluenza Virus 3 NOT DETECTED NOT DETECTED Final   Parainfluenza Virus 4 NOT DETECTED NOT DETECTED Final   Respiratory Syncytial Virus NOT DETECTED NOT DETECTED Final   Bordetella pertussis NOT DETECTED NOT DETECTED Final   Chlamydophila pneumoniae NOT DETECTED NOT DETECTED  Final   Mycoplasma pneumoniae NOT DETECTED NOT DETECTED  Final    Comment: Performed at [facility], [place] [address], [place], [place] [address]  Urine culture     Status: Abnormal   Collection Time: 01/11/19  8:49 PM  Result Value Ref Range Status   Specimen Description URINE, RANDOM  Final   Special Requests   Final    NONE Performed at [facility], [place] [address], [place], [place] [address]    Culture >=100,000 COLONIES/mL STAPHYLOCOCCUS EPIDERMIDIS (A)  Final   Report Status 01/14/2019 FINAL  Final   Organism ID, Bacteria STAPHYLOCOCCUS EPIDERMIDIS (A)  Final      Susceptibility   Staphylococcus epidermidis - MIC*    CIPROFLOXACIN <=0.5 SENSITIVE Sensitive     GENTAMICIN <=0.5 SENSITIVE Sensitive     NITROFURANTOIN <=16 SENSITIVE Sensitive     OXACILLIN <=0.25 SENSITIVE Sensitive     TETRACYCLINE 2 SENSITIVE Sensitive     VANCOMYCIN <=0.5 SENSITIVE Sensitive     TRIMETH/SULFA 160 RESISTANT Resistant     CLINDAMYCIN <=0.25 SENSITIVE Sensitive     RIFAMPIN <=0.5 SENSITIVE Sensitive     Inducible Clindamycin NEGATIVE Sensitive     * >=100,000 COLONIES/mL STAPHYLOCOCCUS EPIDERMIDIS     Labs: BNP (last 3 results) No results for input(s): BNP in the last 8760 hours. Basic Metabolic Panel: Recent Labs  Lab 01/12/19 0334 01/13/19 0217 01/14/19 0347 01/15/19 0124 01/16/19 0326  NA 136 135 135 134* 137  K 3.5 3.6 3.6 3.4* 3.9  CL 105 105 106 106 108  CO2 24 20* 21* 21* 23  GLUCOSE 111* 110* 108* 103* 95  BUN 19 23 24* 22 20  CREATININE 0.85 0.83 0.86 0.88 0.91  CALCIUM 8.1* 8.3* 8.3* 8.4* 8.2*  MG 1.7  --  1.9  --   --   PHOS 3.2  --   --   --   --    Liver Function Tests: Recent Labs  Lab 01/11/19 1350 01/12/19 0334  AST 31 28  ALT 9 9  ALKPHOS 76 77  BILITOT 1.1 1.0  PROT 6.3* 5.4*  ALBUMIN 3.0* 2.6*   No results for input(s): LIPASE, AMYLASE in the last 168 hours. No results for input(s): AMMONIA in the last 168 hours. CBC: Recent Labs  Lab 01/11/19 1350  01/12/19 0334 01/13/19 0217  01/14/19 0347 01/15/19 0124 01/16/19 0326  WBC 19.8*  --  23.0* 18.8* 16.6* 12.8* 11.4*  NEUTROABS 18.6*  --   --   --   --   --   --   HGB 11.8*   < > 11.5* 11.1* 10.8* 10.4* 10.8*  HCT 37.6   < > 35.6* 34.3* 33.3* 32.4* 34.4*  MCV 88.9  --  88.1 86.4 84.5 85.5 87.1  PLT 211  --  216 198 242 280 319   < > = values in this interval not displayed.   Cardiac Enzymes: No results for input(s): CKTOTAL, CKMB, CKMBINDEX, TROPONINI in the last 168 hours. BNP: Invalid input(s): POCBNP CBG: No results for input(s): GLUCAP in the last 168 hours. D-Dimer No results for input(s): DDIMER in the last 72 hours. Hgb A1c No results for input(s): HGBA1C in the last 72 hours. Lipid Profile No results for input(s): CHOL, HDL, LDLCALC, TRIG, CHOLHDL, LDLDIRECT in the last 72 hours. Thyroid function studies Recent Labs    01/14/19 0347  TSH 2.107   Anemia work up No results for input(s): VITAMINB12, FOLATE, FERRITIN, TIBC, IRON, RETICCTPCT  in the last 72 hours. Urinalysis    Component Value Date/Time   COLORURINE YELLOW 01/11/2019 2049   APPEARANCEUR HAZY (A) 01/11/2019 2049   LABSPEC >1.046 (H) 01/11/2019 2049   PHURINE 5.0 01/11/2019 2049   GLUCOSEU NEGATIVE 01/11/2019 2049   HGBUR MODERATE (A) 01/11/2019 2049   BILIRUBINUR NEGATIVE 01/11/2019 2049   KETONESUR 20 (A) 01/11/2019 2049   PROTEINUR NEGATIVE 01/11/2019 2049   NITRITE POSITIVE (A) 01/11/2019 2049   LEUKOCYTESUR SMALL (A) 01/11/2019 2049   Sepsis Labs Invalid input(s): PROCALCITONIN,  WBC,  LACTICIDVEN Microbiology Recent Results (from the past 240 hour(s))  Culture, blood (routine x 2)     Status: Abnormal   Collection Time: 01/11/19  2:05 PM  Result Value Ref Range Status   Specimen Description BLOOD RIGHT ANTECUBITAL  Final   Special Requests   Final    BOTTLES DRAWN AEROBIC AND ANAEROBIC Blood Culture adequate volume   Culture  Setup Time   Final    GRAM NEGATIVE RODS IN BOTH AEROBIC AND ANAEROBIC  BOTTLES CRITICAL VALUE NOTED.  VALUE IS CONSISTENT WITH PREVIOUSLY REPORTED AND CALLED VALUE.    Culture (A)  Final    PSEUDOMONAS AERUGINOSA KLEBSIELLA OXYTOCA SUSCEPTIBILITIES PERFORMED ON PREVIOUS CULTURE WITHIN THE LAST 5 DAYS. Performed at [facility], [place] [address] [place], [place] [address]    Report Status 01/15/2019 FINAL  Final  Culture, blood (routine x 2)     Status: Abnormal   Collection Time: 01/11/19  2:10 PM  Result Value Ref Range Status   Specimen Description BLOOD LEFT HAND  Final   Special Requests   Final    BOTTLES DRAWN AEROBIC AND ANAEROBIC Blood Culture adequate volume   Culture  Setup Time   Final    GRAM POSITIVE COCCI ANAEROBIC BOTTLE ONLY GRAM NEGATIVE RODS IN BOTH AEROBIC AND ANAEROBIC BOTTLES CRITICAL RESULT CALLED TO, READ BACK BY AND VERIFIED WITH: Karlene Einstein PharmD 10:15 01/12/19 ([email or web address])    Culture (A)  Final    PSEUDOMONAS AERUGINOSA KLEBSIELLA OXYTOCA STAPHYLOCOCCUS SPECIES (COAGULASE NEGATIVE) THE SIGNIFICANCE OF ISOLATING THIS ORGANISM FROM A SINGLE SET OF BLOOD CULTURES WHEN MULTIPLE SETS ARE DRAWN IS UNCERTAIN. PLEASE NOTIFY THE MICROBIOLOGY DEPARTMENT WITHIN ONE WEEK IF SPECIATION AND SENSITIVITIES ARE REQUIRED. Performed at [facility], [place] [address], [place], [place] [address]    Report Status 01/14/2019 FINAL  Final   Organism ID, Bacteria PSEUDOMONAS AERUGINOSA  Final   Organism ID, Bacteria KLEBSIELLA OXYTOCA  Final      Susceptibility   Klebsiella oxytoca - MIC*    AMPICILLIN >=32 RESISTANT Resistant     CEFAZOLIN >=64 RESISTANT Resistant     CEFEPIME <=1 SENSITIVE Sensitive     CEFTAZIDIME <=1 SENSITIVE Sensitive     CEFTRIAXONE <=1 SENSITIVE Sensitive     CIPROFLOXACIN <=0.25 SENSITIVE Sensitive     GENTAMICIN <=1 SENSITIVE Sensitive     IMIPENEM 0.5 SENSITIVE Sensitive     TRIMETH/SULFA <=20 SENSITIVE Sensitive     AMPICILLIN/SULBACTAM 8 SENSITIVE Sensitive     PIP/TAZO <=4 SENSITIVE Sensitive      Extended ESBL NEGATIVE Sensitive     * KLEBSIELLA OXYTOCA   Pseudomonas aeruginosa - MIC*    CEFTAZIDIME 4 SENSITIVE Sensitive     CIPROFLOXACIN <=0.25 SENSITIVE Sensitive     GENTAMICIN <=1 SENSITIVE Sensitive     IMIPENEM 8 INTERMEDIATE Intermediate     PIP/TAZO 8 SENSITIVE Sensitive     CEFEPIME 2 SENSITIVE Sensitive     *  PSEUDOMONAS AERUGINOSA  Blood Culture ID Panel (Reflexed)     Status: Abnormal   Collection Time: 01/11/19  2:10 PM  Result Value Ref Range Status   Enterococcus species NOT DETECTED NOT DETECTED Final   Listeria monocytogenes NOT DETECTED NOT DETECTED Final   Staphylococcus species DETECTED (A) NOT DETECTED Final    Comment: Methicillin (oxacillin) susceptible coagulase negative staphylococcus. Possible blood culture contaminant (unless isolated from more than one blood culture draw or clinical case suggests pathogenicity). No antibiotic treatment is indicated for blood  culture contaminants. CRITICAL RESULT CALLED TO, READ BACK BY AND VERIFIED WITH: Karlene Einstein PharmD 10:15 01/12/19 ([email or web address])    Staphylococcus aureus (BCID) NOT DETECTED NOT DETECTED Final   Methicillin resistance NOT DETECTED NOT DETECTED Final   Streptococcus species NOT DETECTED NOT DETECTED Final   Streptococcus agalactiae NOT DETECTED NOT DETECTED Final   Streptococcus pneumoniae NOT DETECTED NOT DETECTED Final   Streptococcus pyogenes NOT DETECTED NOT DETECTED Final   Acinetobacter baumannii NOT DETECTED NOT DETECTED Final   Enterobacteriaceae species DETECTED (A) NOT DETECTED Final    Comment: Enterobacteriaceae represent a large family of gram-negative bacteria, not a single organism. CRITICAL RESULT CALLED TO, READ BACK BY AND VERIFIED WITH: Karlene Einstein PharmD 10:15 01/12/19 ([place]m)    Enterobacter cloacae complex NOT DETECTED NOT DETECTED Final   Escherichia coli NOT DETECTED NOT DETECTED Final   Klebsiella oxytoca DETECTED (A) NOT DETECTED Final    Comment: CRITICAL RESULT  CALLED TO, READ BACK BY AND VERIFIED WITH: Karlene Einstein PharmD 10:15 01/12/19 ([email or web address])    Klebsiella pneumoniae NOT DETECTED NOT DETECTED Final   Proteus species NOT DETECTED NOT DETECTED Final   Serratia marcescens NOT DETECTED NOT DETECTED Final   Carbapenem resistance NOT DETECTED NOT DETECTED Final   Haemophilus influenzae NOT DETECTED NOT DETECTED Final   Neisseria meningitidis NOT DETECTED NOT DETECTED Final   Pseudomonas aeruginosa DETECTED (A) NOT DETECTED Final    Comment: CRITICAL RESULT CALLED TO, READ BACK BY AND VERIFIED WITH: Karlene Einstein PharmD 10:15 01/12/19 ([email or web address])    Candida albicans NOT DETECTED NOT DETECTED Final   Candida glabrata NOT DETECTED NOT DETECTED Final   Candida krusei NOT DETECTED NOT DETECTED Final   Candida parapsilosis NOT DETECTED NOT DETECTED Final   Candida tropicalis NOT DETECTED NOT DETECTED Final    Comment: Performed at [facility], [place]. [address], [place], [place] [address]  Respiratory Panel by PCR     Status: None   Collection Time: 01/11/19  3:00 PM  Result Value Ref Range Status   Adenovirus NOT DETECTED NOT DETECTED Final   Coronavirus 229E NOT DETECTED NOT DETECTED Final    Comment: (NOTE) The Coronavirus on the Respiratory Panel, DOES NOT test for the novel  Coronavirus (2019 nCoV)    Coronavirus HKU1 NOT DETECTED NOT DETECTED Final   Coronavirus NL63 NOT DETECTED NOT DETECTED Final   Coronavirus OC43 NOT DETECTED NOT DETECTED Final   Metapneumovirus NOT DETECTED NOT DETECTED Final   Rhinovirus / Enterovirus NOT DETECTED NOT DETECTED Final   Influenza A NOT DETECTED NOT DETECTED Final   Influenza B NOT DETECTED NOT DETECTED Final   Parainfluenza Virus 1 NOT DETECTED NOT DETECTED Final   Parainfluenza Virus 2 NOT DETECTED NOT DETECTED Final   Parainfluenza Virus 3 NOT DETECTED NOT DETECTED Final   Parainfluenza Virus 4 NOT DETECTED NOT DETECTED Final   Respiratory Syncytial Virus NOT DETECTED NOT DETECTED Final    Bordetella pertussis NOT DETECTED NOT DETECTED Final  Chlamydophila pneumoniae NOT DETECTED NOT DETECTED Final   Mycoplasma pneumoniae NOT DETECTED NOT DETECTED Final    Comment: Performed at [facility], [place] [address], [place], [place] [address]  Urine culture     Status: Abnormal   Collection Time: 01/11/19  8:49 PM  Result Value Ref Range Status   Specimen Description URINE, RANDOM  Final   Special Requests   Final    NONE Performed at [facility], [place] [address] [place], [place] [address]    Culture >=100,000 COLONIES/mL STAPHYLOCOCCUS EPIDERMIDIS (A)  Final   Report Status 01/14/2019 FINAL  Final   Organism ID, Bacteria STAPHYLOCOCCUS EPIDERMIDIS (A)  Final      Susceptibility   Staphylococcus epidermidis - MIC*    CIPROFLOXACIN <=0.5 SENSITIVE Sensitive     GENTAMICIN <=0.5 SENSITIVE Sensitive     NITROFURANTOIN <=16 SENSITIVE Sensitive     OXACILLIN <=0.25 SENSITIVE Sensitive     TETRACYCLINE 2 SENSITIVE Sensitive     VANCOMYCIN <=0.5 SENSITIVE Sensitive     TRIMETH/SULFA 160 RESISTANT Resistant     CLINDAMYCIN <=0.25 SENSITIVE Sensitive     RIFAMPIN <=0.5 SENSITIVE Sensitive     Inducible Clindamycin NEGATIVE Sensitive     * >=100,000 COLONIES/mL STAPHYLOCOCCUS EPIDERMIDIS     Time coordinating discharge: 40 minutes      SIGNED:   Edwin Dada, MD  [facility] 01/16/2019, 1:44 PM

## 2019-01-16 NOTE — Progress Notes (Signed)
Clinical Social Worker facilitated patient discharge including contacting patient family and facility to confirm patient discharge plans.  Clinical information faxed to facility and family agreeable with plan.  CSW arranged ambulance transport via [facility] to [facility].  RN to call for report prior to discharge [phone]).  Clinical Social Worker will sign off for now as social work intervention is no longer needed. Please consult [place] again if new need arises.  [place], [place]

## 2019-01-16 NOTE — Progress Notes (Signed)
Attempted to call report x 2 . Able to go throught the receptionist but not the nursing staff

## 2019-01-16 NOTE — Progress Notes (Signed)
Report given to [facility] at [facility] center.

## 2019-01-18 ENCOUNTER — Non-Acute Institutional Stay (SKILLED_NURSING_FACILITY): Payer: Medicare Other | Admitting: Internal Medicine

## 2019-01-18 ENCOUNTER — Encounter: Payer: Self-pay | Admitting: Internal Medicine

## 2019-01-18 DIAGNOSIS — A414 Sepsis due to anaerobes: Secondary | ICD-10-CM

## 2019-01-18 DIAGNOSIS — H04123 Dry eye syndrome of bilateral lacrimal glands: Secondary | ICD-10-CM

## 2019-01-18 DIAGNOSIS — I10 Essential (primary) hypertension: Secondary | ICD-10-CM

## 2019-01-18 DIAGNOSIS — F0280 Dementia in other diseases classified elsewhere without behavioral disturbance: Secondary | ICD-10-CM

## 2019-01-18 DIAGNOSIS — A4152 Sepsis due to Pseudomonas: Secondary | ICD-10-CM

## 2019-01-18 DIAGNOSIS — G9341 Metabolic encephalopathy: Secondary | ICD-10-CM

## 2019-01-18 DIAGNOSIS — R6520 Severe sepsis without septic shock: Secondary | ICD-10-CM | POA: Diagnosis not present

## 2019-01-18 DIAGNOSIS — I480 Paroxysmal atrial fibrillation: Secondary | ICD-10-CM

## 2019-01-18 DIAGNOSIS — G9340 Encephalopathy, unspecified: Secondary | ICD-10-CM | POA: Diagnosis not present

## 2019-01-18 DIAGNOSIS — G301 Alzheimer's disease with late onset: Secondary | ICD-10-CM | POA: Diagnosis not present

## 2019-01-18 DIAGNOSIS — A4159 Other Gram-negative sepsis: Secondary | ICD-10-CM

## 2019-01-18 NOTE — Progress Notes (Signed)
:  Location:  [facility] Room Number: [phone] Place of Service:  SNF (31)  Amanda Hinton. Amanda Coil, MD  Patient Care Team: Amanda Cruel, MD as PCP - General  Extended Emergency Contact Information Primary Emergency Contact: Amanda Hinton Address: [address]          [place], [place] [address] Amanda Hinton of [place] Phone: [phone] Work Phone: [phone] Relation: Daughter     Allergies: Avelox [moxifloxacin hcl in nacl] and Prednisone  Chief Complaint  Patient presents with  . New Admit To SNF    Admit to [facility]    HPI: Patient is 85 y.o. female with dementia, hypertension, hyperlipidemia, COPD, osteoporosis, AAA 3.2 cm under surveillance, who presented to the ED with confusion since the morning of admission and incontinence of urine..  Per daughter patient had not been feeling well but she could not tell what was wrong because she has had no cough or shortness of breath or chest pain.  She had noted some dysuria and pain in her lower abdomen.  And one episode of vomiting this morning.  In the ED patient was noted to have an O2 sat of 89%, was febrile to 103.3 with respiratory rate of 21 and a white blood cell count of 19.8.  Patient was admitted to [facility] from 2/20 4-29 where she was treated for Pseudomonas and Klebsiella sepsis and hypoxia requiring supplemental oxygen.  Hospital course was complicated by new onset atrial fib.  Patient is admitted to skilled nursing facility for OT/PT.  While at skilled nursing facility patient will be followed for hypertension treated with diltiazem metoprolol, dementia treated with Aricept and dry eye syndrome treated with Restasis.  Past Medical History:  Diagnosis Date  . AAA (abdominal aortic aneurysm) ([place]) 2014  . COPD (chronic obstructive pulmonary disease) ([place])   . Hyperlipemia   . Hypertension   . Osteoporosis     Past Surgical History:  Procedure Laterality Date    . APPENDECTOMY  1968  . biopsy of uterus  1968 and 1985  . COLONOSCOPY  01/2010    Allergies as of 01/18/2019      Reactions   Avelox [moxifloxacin Hcl In Nacl] Other (See Comments)   Prolonged QT wave   Prednisone Other (See Comments)   REACTION: Disoriented      Medication List       Accurate as of January 18, 2019 10:46 AM. Always use your most recent med list.        acetaminophen 500 MG tablet Commonly known as:  TYLENOL Take 2 tablets (1,000 mg total) by mouth at bedtime.   acetaminophen 325 MG tablet Commonly known as:  TYLENOL Take 2 tablets (650 mg total) by mouth every 6 (six) hours as needed for mild pain (or Fever >/= 101).   apixaban 2.5 MG Tabs tablet Commonly known as:  ELIQUIS Take 1 tablet (2.5 mg total) by mouth 2 (two) times daily.   aspirin 81 MG tablet Take 81 mg by mouth daily.   b complex vitamins tablet Take 1 tablet by mouth daily.   ceFEPime  IVPB Commonly known as:  MAXIPIME Inject 2 g into the vein every 12 (twelve) hours for 8 days. Indication:  Bloodstream infection Last Day of Therapy:  01/25/19 Labs - Once weekly:  CBC/D and BMP, Labs - Every other week:  ESR and CRP   cycloSPORINE 0.05 % ophthalmic emulsion Commonly known as:  RESTASIS Place 1  drop into both eyes 2 (two) times daily.   diltiazem 120 MG 24 hr capsule Commonly known as:  CARDIZEM CD Take 1 capsule (120 mg total) by mouth every evening for 30 days.   docusate sodium 100 MG capsule Commonly known as:  COLACE Take 100 mg by mouth daily as needed for mild constipation.   donepezil 5 MG tablet Commonly known as:  ARICEPT Take 5 mg by mouth daily.   KLOR-CON PO Take 1 tablet by mouth daily.   metoprolol tartrate 100 MG tablet Commonly known as:  LOPRESSOR Take 1 tablet (100 mg total) by mouth 2 (two) times daily.   multivitamin with minerals tablet Take 1 tablet by mouth daily.   polyethylene glycol packet Commonly known as:  MIRALAX / GLYCOLAX Take 17 g by  mouth daily as needed for mild constipation.   valACYclovir 500 MG tablet Commonly known as:  VALTREX Take 500 mg by mouth daily.       No orders of the defined types were placed in this encounter.    There is no immunization history on file for this patient.  Social History   Tobacco Use  . Smoking status: Former Smoker    Packs/day: 1.00    Years: 54.00    Pack years: 54.00    Types: Cigarettes    Last attempt to quit: 12/19/2010    Years since quitting: 8.0  . Smokeless tobacco: Never Used  . Tobacco comment: quit 2012  Substance Use Topics  . Alcohol use: No    Frequency: Never    Family history is   Family History  Problem Relation Age of Onset  . Heart disease Mother       Review of Systems  DATA OBTAINED: from patient-limited; nursing-no acute concerns GENERAL:  no fevers, fatigue, appetite changes SKIN: No itching, or rash EYES: No eye pain, redness, discharge EARS: No earache, tinnitus, change in hearing NOSE: No congestion, drainage or bleeding  MOUTH/THROAT: No mouth or tooth pain, No sore throat RESPIRATORY: No cough, wheezing, SOB CARDIAC: No chest pain, palpitations, lower extremity edema  GI: No abdominal pain, No N/V/D or constipation, No heartburn or reflux  GU: No dysuria, frequency or urgency, or incontinence  MUSCULOSKELETAL: No unrelieved bone/joint pain NEUROLOGIC: No headache, dizziness or focal weakness PSYCHIATRIC: No c/o anxiety or sadness   Vitals:   01/18/19 1043  BP: 107/72  Pulse: (!) 106  Resp: 19  Temp: (!) 97.1 F (36.2 C)    SpO2 Readings from Last 1 Encounters:  01/16/19 95%   Body mass index is 21.63 kg/m.     Physical Exam  GENERAL APPEARANCE: Alert, conversant,  No acute distress.  SKIN: No diaphoresis rash HEAD: Normocephalic, atraumatic  EYES: Conjunctiva/lids clear. Pupils round, reactive. EOMs intact.  EARS: External exam WNL, canals clear. Hearing grossly normal.  NOSE: No deformity or discharge.   MOUTH/THROAT: Lips w/o lesions  RESPIRATORY: Breathing is even, unlabored. Lung sounds are clear   CARDIOVASCULAR: Heart RRR no murmurs, rubs or gallops. No peripheral edema.   GASTROINTESTINAL: Abdomen is soft, non-tender, not distended w/ normal bowel sounds. GENITOURINARY: Bladder non tender, not distended  MUSCULOSKELETAL: No abnormal joints or musculature NEUROLOGIC:  Cranial nerves 2-12 grossly intact. Moves all extremities  PSYCHIATRIC: Mood and affect dementia, no behavioral issues  Patient Active Problem List   Diagnosis Date Noted  . SIRS (systemic inflammatory response syndrome) ([place]) 01/11/2019  . CAP (community acquired pneumonia) 01/11/2019  . Acute respiratory failure with hypoxia ([place])  01/11/2019  . PNEUMONIA ORGANISM NOS 01/24/2011  . Hyperlipidemia, unspecified 01/23/2011  . Essential hypertension 01/23/2011  . COPD  GOLD II 01/23/2011  . OSTEOPOROSIS 01/23/2011      Labs reviewed: Basic Metabolic Panel:    Component Value Date/Time   NA 137 01/16/2019 0326   K 3.9 01/16/2019 0326   CL 108 01/16/2019 0326   CO2 23 01/16/2019 0326   GLUCOSE 95 01/16/2019 0326   BUN 20 01/16/2019 0326   CREATININE 0.91 01/16/2019 0326   CALCIUM 8.2 (L) 01/16/2019 0326   PROT 5.4 (L) 01/12/2019 0334   ALBUMIN 2.6 (L) 01/12/2019 0334   AST 28 01/12/2019 0334   ALT 9 01/12/2019 0334   ALKPHOS 77 01/12/2019 0334   BILITOT 1.0 01/12/2019 0334   GFRNONAA 57 (L) 01/16/2019 0326   GFRAA >60 01/16/2019 0326    Recent Labs    01/12/19 0334  01/14/19 0347 01/15/19 0124 01/16/19 0326  NA 136   < > 135 134* 137  K 3.5   < > 3.6 3.4* 3.9  CL 105   < > 106 106 108  CO2 24   < > 21* 21* 23  GLUCOSE 111*   < > 108* 103* 95  BUN 19   < > 24* 22 20  CREATININE 0.85   < > 0.86 0.88 0.91  CALCIUM 8.1*   < > 8.3* 8.4* 8.2*  MG 1.7  --  1.9  --   --   PHOS 3.2  --   --   --   --    < > = values in this interval not displayed.   Liver Function Tests: Recent Labs     01/11/19 1350 01/12/19 0334  AST 31 28  ALT 9 9  ALKPHOS 76 77  BILITOT 1.1 1.0  PROT 6.3* 5.4*  ALBUMIN 3.0* 2.6*   No results for input(s): LIPASE, AMYLASE in the last 8760 hours. No results for input(s): AMMONIA in the last 8760 hours. CBC: Recent Labs    05/16/18 1307 01/11/19 1350  01/14/19 0347 01/15/19 0124 01/16/19 0326  WBC 13.5* 19.8*   < > 16.6* 12.8* 11.4*  NEUTROABS 11.1* 18.6*  --   --   --   --   HGB 13.0 11.8*   < > 10.8* 10.4* 10.8*  HCT 40.6 37.6   < > 33.3* 32.4* 34.4*  MCV 86.9 88.9   < > 84.5 85.5 87.1  PLT 312 211   < > 242 280 319   < > = values in this interval not displayed.   Lipid No results for input(s): CHOL, HDL, LDLCALC, TRIG in the last 8760 hours.  Cardiac Enzymes: No results for input(s): CKTOTAL, CKMB, CKMBINDEX, TROPONINI in the last 8760 hours. BNP: No results for input(s): BNP in the last 8760 hours. No results found for: [facility] Lab Results  Component Value Date   HGBA1C (H) 12/28/2010    5.8 (NOTE)                                                                       According to the ADA Clinical Practice Recommendations for 2011, when HbA1c is used as a screening test:   >=6.5%   Diagnostic of Diabetes  [place]           (if abnormal result  is confirmed)  5.7-6.4%   Increased risk of developing Diabetes Mellitus  References:Diagnosis and Classification of Diabetes Mellitus,Diabetes VHQI,6962,95(MWUXL 1):S62-S69 and Standards of Medical Care in         Diabetes - 2011,Diabetes KGMW,1027,25  (Suppl 1):S11-S61.   Lab Results  Component Value Date   TSH 2.107 01/14/2019   No results found for: VITAMINB12 No results found for: FOLATE No results found for: IRON, TIBC, FERRITIN  Imaging and Procedures obtained prior to SNF admission: Dg Chest 2 View  Result Date: 01/11/2019 CLINICAL DATA:  Altered mental status, incontinence and possible pneumonia. EXAM: CHEST - 2 VIEW COMPARISON:  11/07/2015 FINDINGS: The heart size is at  the upper limits of normal. There may be a subtle right upper lobe infiltrate. Chronic lung disease present with potential mild interstitial edema. No pleural effusions or evidence of pneumothorax. IMPRESSION: Possible subtle right upper lobe infiltrate. Chronic lung disease with potential mild interstitial edema. Electronically Signed   By: Aletta Edouard M.D.   On: 01/11/2019 16:14   Ct Abdomen Pelvis W Contrast  Result Date: 01/11/2019 CLINICAL DATA:  85 year old female with confusion. Fever. Known aneurysm which is stable. Prior appendectomy. Initial encounter. EXAM: CT ABDOMEN AND PELVIS WITH CONTRAST TECHNIQUE: Multidetector CT imaging of the abdomen and pelvis was performed using the standard protocol following bolus administration of intravenous contrast. CONTRAST:  138m OMNIPAQUE IOHEXOL 300 MG/ML  SOLN COMPARISON:  Pelvic CT 07/07/2018. 06/16/2017 abdominal aortic ultrasound. FINDINGS: Lower chest: Basilar parenchymal changes suggestive of scarring/atelectasis. Peribronchial thickening left lower lobe may represent chronic changes although can not exclude component of bronchitis. Left base small calcified granuloma. Cardiomegaly. Mitral valve calcification. Coronary artery calcification. Hepatobiliary: Motion degraded exam. No worrisome hepatic lesion. No calcified gallstones. Limited for evaluating for gallbladder inflammation secondary to motion artifact. Pancreas: Motion degraded exam. No worrisome pancreatic mass or inflammation noted. Spleen: Motion degraded exam causes artifact near the hilum. Adrenals/Urinary Tract: Motion degraded exam. No obstructing stone or hydronephrosis. No worrisome renal mass. Right lower pole 7 mm nonobstructing stone. No worrisome adrenal lesion. Noncontrast filled views of the urinary bladder unremarkable. Stomach/Bowel: Significant sigmoid diverticula and muscular hypertrophy. Slightly engorged vessels within the sigmoid mesentery but without discrete evidence of  diverticulitis. Other portions of bowel are limited by motion degradation however, no extraluminal bowel inflammatory process noted. Moderate-size hiatal hernia. Partially fluid-filled normal size small bowel loops. Vascular/Lymphatic: Prominent atherosclerotic changes abdominal aorta and aortic branch vessels. Infrarenal abdominal aortic aneurysm measuring up to 3.2 cm versus 3 cm on prior ultrasound. Prominent narrowing origin of the celiac artery, superior mesenteric artery, inferior mesenteric artery and renal arteries. Significant narrowing iliac arteries. Scattered normal size lymph nodes. Reproductive: No worrisome uterine or adnexal mass. Other: No free intraperitoneal air or bowel containing hernia. Musculoskeletal: Remote left pubic body fracture with incomplete healing. Remote sacral fractures. Prominent scoliosis lower thoracic and lumbar spine convex left with superimposed degenerative changes. IMPRESSION: 1. Exam is motion degraded which limits evaluation for detection of an inflammatory process (particularly at the level of the gallbladder and upper abdomen). Taking this limitation into account, no discrete inflammatory process is noted. 2. Significant sigmoid diverticulosis with muscular hypertrophy. Slightly engorged vessels within the sigmoid mesentery but without discrete findings of diverticulitis. 3. Moderate size hiatal hernia. 4. Peribronchial thickening left lower lobe may represent chronic changes although can not exclude component of bronchitis. Basilar atelectasis/scarring. 5. Cardiomegaly.  Coronary artery calcification. 6.  Aortic Atherosclerosis (ICD10-I70.0). Abdominal aortic aneurysm measures 3.2 cm versus prior 3 cm. Recommend followup by ultrasound in 3 years. This recommendation follows ACR consensus guidelines: White Paper of the ACR Incidental Findings Committee II on Vascular Findings. J Am Coll Radiol 2013; 36:468-032 7. Prominent narrowing origin of the celiac artery, superior  mesenteric artery, inferior mesenteric artery and renal arteries. Significant narrowing iliac arteries. 8. Remote left pubic body fracture with incomplete healing. Remote sacral fractures. 9. Prominent scoliosis lumbar spine convex left with superimposed degenerative changes. Electronically Signed   By: Genia Del M.D.   On: 01/11/2019 17:31     Not all labs, radiology exams or other studies done during hospitalization come through on my EPIC note; however they are reviewed by me.    Assessment and Plan  Sepsis from Pseudomonas and Klebsiella concurrently-patient admitted with tachycardia leukocytosis confusion suspected source ultimately unclear, possibly urine although urine culture not concordant; chest x-ray with minor right upper lobe opacity-patient treated with cefepime until 01/25/2019 SNF- admitted for OT/PT; continue cefepime 2 g every 12 hours last day 01/25/2019  Delirium/dementia-delirium resolved SNF- continue Aricept 5 mg nightly for dementia  Hypoxia-presented with a respiratory rate 26 O2 sat of 89% requiring submental oxygen likely from sepsis, resolved  New onset atrial fibrillation- chads BASC score 4, has bled 1-consider low risk SNF-patient already on Eliquis 2.5 mg twice daily will continue and continue metoprolol 100 mg twice daily and diltiazem CD 120 mg daily  Hypertension SNF-controlled; continue diltiazem 120 mg daily, metoprolol 100 mg twice daily  Dry eye syndrome SNF-continue Restasis 0.05% 1 drop both eyes twice daily   Time spent greater than 45 minutes;> 50% of time with patient was spent reviewing records, labs, tests and studies, counseling and developing plan of care  Webb Silversmith D. Amanda Coil, MD

## 2019-01-19 ENCOUNTER — Encounter: Payer: Self-pay | Admitting: Internal Medicine

## 2019-01-20 ENCOUNTER — Encounter: Payer: Self-pay | Admitting: Internal Medicine

## 2019-01-20 DIAGNOSIS — H04129 Dry eye syndrome of unspecified lacrimal gland: Secondary | ICD-10-CM | POA: Insufficient documentation

## 2019-01-20 DIAGNOSIS — G9340 Encephalopathy, unspecified: Secondary | ICD-10-CM | POA: Insufficient documentation

## 2019-01-20 DIAGNOSIS — I4891 Unspecified atrial fibrillation: Secondary | ICD-10-CM | POA: Insufficient documentation

## 2019-01-20 DIAGNOSIS — A414 Sepsis due to anaerobes: Secondary | ICD-10-CM | POA: Insufficient documentation

## 2019-01-20 DIAGNOSIS — A4159 Other Gram-negative sepsis: Secondary | ICD-10-CM | POA: Insufficient documentation

## 2019-01-20 DIAGNOSIS — F0390 Unspecified dementia without behavioral disturbance: Secondary | ICD-10-CM | POA: Insufficient documentation

## 2019-01-20 DIAGNOSIS — A4152 Sepsis due to Pseudomonas: Secondary | ICD-10-CM | POA: Insufficient documentation

## 2019-01-20 LAB — BASIC METABOLIC PANEL
BUN: 16 (ref 4–21)
Creatinine: 0.7 (ref 0.5–1.1)
Glucose: 86
Potassium: 4.4 (ref 3.4–5.3)
Sodium: 141 (ref 137–147)

## 2019-01-20 LAB — POCT ERYTHROCYTE SEDIMENTATION RATE, NON-AUTOMATED: Sed Rate: 41

## 2019-01-20 LAB — CBC AND DIFFERENTIAL
HEMATOCRIT: 34 — AB (ref 36–46)
Hemoglobin: 11.1 — AB (ref 12.0–16.0)
Platelets: 587 — AB (ref 150–399)
WBC: 14.9

## 2019-01-21 ENCOUNTER — Other Ambulatory Visit: Payer: Self-pay | Admitting: *Deleted

## 2019-01-21 LAB — POCT ERYTHROCYTE SEDIMENTATION RATE, NON-AUTOMATED: SED RATE: 35

## 2019-01-25 NOTE — Patient Outreach (Signed)
[facility]) Care Management  01/21/19  Amanda Hinton May 17, 1930 [id number]   Facility site visit to [facility]. Collaboration with [facility] concerning patient's progress, discharge plan and potential care management needs. Facility rep unavailable at this time. [facility] RN stated patient's discharge plan was not certain and daughter was looking into a higher level of care, but had not made a decision. Patient has a history of dementia, but able to have a conversation according to bedside nurse, this nurse had no information concerning d/c plan. Patient admitted to SNF on 01/16/19 after a hospitalization for Sepsis. Planned discharge date is not set and discharge disposition is not set.  Patient was evaluated for community based chronic disease management services with [facility] care Management Program as a benefit of patient's Next Gen  Medicare.   Attempted to see patient at the bedside but patient asleep and unable to rouse.   Plan to make new [facility] PAC aware of patient.  For questions please contact:   Hallis Meditz RN, [facility] Liaison  [phone]) [employer] [phone]) Toll free office

## 2019-01-29 ENCOUNTER — Encounter: Payer: Self-pay | Admitting: Internal Medicine

## 2019-01-29 ENCOUNTER — Non-Acute Institutional Stay (SKILLED_NURSING_FACILITY): Payer: Medicare Other | Admitting: Internal Medicine

## 2019-01-29 DIAGNOSIS — J189 Pneumonia, unspecified organism: Secondary | ICD-10-CM

## 2019-01-29 NOTE — Progress Notes (Signed)
:  Location:  [facility] Room Number: [phone] Place of Service:  SNF (31)  Noah Delaine. [name], MD  Patient Care Team: Lawerance Cruel, MD as PCP - General  Extended Emergency Contact Information Primary Emergency Contact: [facility] Address: [address]          [place], [place] [address] [name] of [place] Phone: [phone] Work Phone: [phone] Relation: Daughter     Allergies: Avelox [moxifloxacin hcl in nacl] and Prednisone  Chief Complaint  Patient presents with  . Acute Visit    HPI: Patient is 85 y.o. female who who is being seen for pneumonia.  Across the day she has been seen to be coughing and her O2 saturation decreased to 83% on room air and improved to 89.9% on 2 L of O2.  Patient has decreased breath sounds at both bases but no wheezing.  Patient admits to some shortness of breath, no chest pain  Past Medical History:  Diagnosis Date  . AAA (abdominal aortic aneurysm) ([facility]) 2014  . COPD (chronic obstructive pulmonary disease) ([facility])   . Hyperlipemia   . Hypertension   . Osteoporosis     Past Surgical History:  Procedure Laterality Date  . APPENDECTOMY  1968  . biopsy of uterus  1968 and 1985  . COLONOSCOPY  01/2010    Allergies as of 01/29/2019      Reactions   Avelox [moxifloxacin Hcl In Nacl] Other (See Comments)   Prolonged QT wave   Prednisone Other (See Comments)   REACTION: Disoriented      Medication List       Accurate as of January 29, 2019  3:48 PM. Always use your most recent med list.        acetaminophen 500 MG tablet Commonly known as:  TYLENOL Take 2 tablets (1,000 mg total) by mouth at bedtime.   acetaminophen 325 MG tablet Commonly known as:  TYLENOL Take 2 tablets (650 mg total) by mouth every 6 (six) hours as needed for mild pain (or Fever >/= 101).   apixaban 2.5 MG Tabs tablet Commonly known as:  ELIQUIS Take 1 tablet (2.5 mg total) by mouth 2 (two) times  daily.   aspirin 81 MG tablet Take 81 mg by mouth daily.   b complex vitamins tablet Take 1 tablet by mouth daily.   cefTRIAXone  IVPB Commonly known as:  ROCEPHIN Inject 1 g into the vein. Start taking on:  January 31, 2019   cycloSPORINE 0.05 % ophthalmic emulsion Commonly known as:  RESTASIS Place 1 drop into both eyes 2 (two) times daily.   diltiazem 120 MG 24 hr capsule Commonly known as:  Cardizem CD Take 1 capsule (120 mg total) by mouth every evening for 30 days.   docusate sodium 100 MG capsule Commonly known as:  COLACE Take 100 mg by mouth daily as needed for mild constipation.   donepezil 5 MG tablet Commonly known as:  ARICEPT Take 5 mg by mouth daily.   KLOR-CON PO Take 1 tablet by mouth daily.   metoprolol tartrate 100 MG tablet Commonly known as:  LOPRESSOR Take 1 tablet (100 mg total) by mouth 2 (two) times daily.   multivitamin with minerals tablet Take 1 tablet by mouth daily.   polyethylene glycol packet Commonly known as:  MIRALAX / GLYCOLAX Take 17 g by mouth daily as needed for mild constipation.   valACYclovir 500 MG tablet Commonly known as:  VALTREX Take  500 mg by mouth daily.       No orders of the defined types were placed in this encounter.    There is no immunization history on file for this patient.  Social History   Tobacco Use  . Smoking status: Former Smoker    Packs/day: 1.00    Years: 54.00    Pack years: 54.00    Types: Cigarettes    Last attempt to quit: 12/19/2010    Years since quitting: 8.1  . Smokeless tobacco: Never Used  . Tobacco comment: quit 2012  Substance Use Topics  . Alcohol use: No    Frequency: Never    Family history is   Family History  Problem Relation Age of Onset  . Heart disease Mother       Review of Systems  DATA OBTAINED: from patient, nurse GENERAL:  no fevers,+ fatigue, appetite changes SKIN: No itching, or rash EYES: No eye pain, redness, discharge EARS: No earache,  tinnitus, change in hearing NOSE: No congestion, drainage or bleeding  MOUTH/THROAT: No mouth or tooth pain, No sore throat RESPIRATORY: +cough,- wheezing, +SOB CARDIAC: No chest pain, palpitations, lower extremity edema  GI: No abdominal pain, No N/V/D or constipation, No heartburn or reflux  GU: No dysuria, frequency or urgency, or incontinence  MUSCULOSKELETAL: No unrelieved bone/joint pain NEUROLOGIC: No headache, dizziness or focal weakness PSYCHIATRIC: No c/o anxiety or sadness   Vitals:   01/29/19 1537  BP: 129/68  Pulse: 78  Resp: 19  Temp: (!) 97.2 F (36.2 C)    SpO2 Readings from Last 1 Encounters:  01/16/19 95%   Body mass index is 21.63 kg/m.     Physical Exam  GENERAL APPEARANCE: Alert, looks sick, not toxic SKIN: No diaphoresis rash HEAD: Normocephalic, atraumatic  EYES: Conjunctiva/lids clear. Pupils round, reactive. EOMs intact.  EARS: External exam WNL, canals clear. Hearing grossly normal.  NOSE: No deformity or discharge.  MOUTH/THROAT: Lips w/o lesions  RESPIRATORY: Breathing is even, unlabored. Lung sounds are decreased breath sounds at bases CARDIOVASCULAR: Heart RRR no murmurs, rubs or gallops. No peripheral edema.   GASTROINTESTINAL: Abdomen is soft, non-tender, not distended w/ normal bowel sounds. GENITOURINARY: Bladder non tender, not distended  MUSCULOSKELETAL: No abnormal joints or musculature NEUROLOGIC:  Cranial nerves 2-12 grossly intact. Moves all extremities  PSYCHIATRIC: Mood and affect dementia, no behavioral issues  Patient Active Problem List   Diagnosis Date Noted  . Pseudomonas sepsis ([facility]) 01/20/2019  . Klebsiella sepsis ([facility]) 01/20/2019  . Acute encephalopathy 01/20/2019  . Dementia without behavioral disturbance ([facility]) 01/20/2019  . Atrial fibrillation ([facility]) 01/20/2019  . Dry eye syndrome 01/20/2019  . SIRS (systemic inflammatory response syndrome) ([facility]) 01/11/2019  . CAP (community acquired pneumonia) 01/11/2019  .  Acute respiratory failure with hypoxia ([facility]) 01/11/2019  . PNEUMONIA ORGANISM NOS 01/24/2011  . Hyperlipidemia, unspecified 01/23/2011  . Essential hypertension 01/23/2011  . COPD  GOLD II 01/23/2011  . OSTEOPOROSIS 01/23/2011      Labs reviewed: Basic Metabolic Panel:    Component Value Date/Time   NA 141 01/20/2019   K 4.4 01/20/2019   CL 108 01/16/2019 0326   CO2 23 01/16/2019 0326   GLUCOSE 95 01/16/2019 0326   BUN 16 01/20/2019   CREATININE 0.7 01/20/2019   CREATININE 0.91 01/16/2019 0326   CALCIUM 8.2 (L) 01/16/2019 0326   PROT 5.4 (L) 01/12/2019 0334   ALBUMIN 2.6 (L) 01/12/2019 0334   AST 28 01/12/2019 0334   ALT 9 01/12/2019 0334  ALKPHOS 77 01/12/2019 0334   BILITOT 1.0 01/12/2019 0334   GFRNONAA 57 (L) 01/16/2019 0326   GFRAA >60 01/16/2019 0326    Recent Labs    01/12/19 0334  01/14/19 0347 01/15/19 0124 01/16/19 0326 01/20/19  NA 136   < > 135 134* 137 141  K 3.5   < > 3.6 3.4* 3.9 4.4  CL 105   < > 106 106 108  --   CO2 24   < > 21* 21* 23  --   GLUCOSE 111*   < > 108* 103* 95  --   BUN 19   < > 24* 22 20 16   CREATININE 0.85   < > 0.86 0.88 0.91 0.7  CALCIUM 8.1*   < > 8.3* 8.4* 8.2*  --   MG 1.7  --  1.9  --   --   --   PHOS 3.2  --   --   --   --   --    < > = values in this interval not displayed.   Liver Function Tests: Recent Labs    01/11/19 1350 01/12/19 0334  AST 31 28  ALT 9 9  ALKPHOS 76 77  BILITOT 1.1 1.0  PROT 6.3* 5.4*  ALBUMIN 3.0* 2.6*   No results for input(s): LIPASE, AMYLASE in the last 8760 hours. No results for input(s): AMMONIA in the last 8760 hours. CBC: Recent Labs    05/16/18 1307 01/11/19 1350  01/14/19 0347 01/15/19 0124 01/16/19 0326 01/20/19  WBC 13.5* 19.8*   < > 16.6* 12.8* 11.4* 14.9  NEUTROABS 11.1* 18.6*  --   --   --   --   --   HGB 13.0 11.8*   < > 10.8* 10.4* 10.8* 11.1*  HCT 40.6 37.6   < > 33.3* 32.4* 34.4* 34*  MCV 86.9 88.9   < > 84.5 85.5 87.1  --   PLT 312 211   < > 242 280 319  587*   < > = values in this interval not displayed.   Lipid No results for input(s): CHOL, HDL, LDLCALC, TRIG in the last 8760 hours.  Cardiac Enzymes: No results for input(s): CKTOTAL, CKMB, CKMBINDEX, TROPONINI in the last 8760 hours. BNP: No results for input(s): BNP in the last 8760 hours. No results found for: [facility] Lab Results  Component Value Date   HGBA1C (H) 12/28/2010    5.8 (NOTE)                                                                       According to the ADA Clinical Practice Recommendations for 2011, when HbA1c is used as a screening test:   >=6.5%   Diagnostic of Diabetes Mellitus           (if abnormal result  is confirmed)  5.7-6.4%   Increased risk of developing Diabetes Mellitus  References:Diagnosis and Classification of Diabetes Mellitus,Diabetes EYCX,4481,85(UDJSH 1):S62-S69 and Standards of Medical Care in         Diabetes - 2011,Diabetes FWYO,3785,88  (Suppl 1):S11-S61.   Lab Results  Component Value Date   TSH 2.107 01/14/2019   No results found for: VITAMINB12 No results found for: FOLATE No results found for: IRON,  TIBC, FERRITIN  Imaging and Procedures obtained prior to SNF admission: Dg Chest 2 View  Result Date: 01/11/2019 CLINICAL DATA:  Altered mental status, incontinence and possible pneumonia. EXAM: CHEST - 2 VIEW COMPARISON:  11/07/2015 FINDINGS: The heart size is at the upper limits of normal. There may be a subtle right upper lobe infiltrate. Chronic lung disease present with potential mild interstitial edema. No pleural effusions or evidence of pneumothorax. IMPRESSION: Possible subtle right upper lobe infiltrate. Chronic lung disease with potential mild interstitial edema. Electronically Signed   By: Aletta Edouard M.D.   On: 01/11/2019 16:14   Ct Abdomen Pelvis W Contrast  Result Date: 01/11/2019 CLINICAL DATA:  85 year old female with confusion. Fever. Known aneurysm which is stable. Prior appendectomy. Initial encounter.  EXAM: CT ABDOMEN AND PELVIS WITH CONTRAST TECHNIQUE: Multidetector CT imaging of the abdomen and pelvis was performed using the standard protocol following bolus administration of intravenous contrast. CONTRAST:  114mL OMNIPAQUE IOHEXOL 300 MG/ML  SOLN COMPARISON:  Pelvic CT 07/07/2018. 06/16/2017 abdominal aortic ultrasound. FINDINGS: Lower chest: Basilar parenchymal changes suggestive of scarring/atelectasis. Peribronchial thickening left lower lobe may represent chronic changes although can not exclude component of bronchitis. Left base small calcified granuloma. Cardiomegaly. Mitral valve calcification. Coronary artery calcification. Hepatobiliary: Motion degraded exam. No worrisome hepatic lesion. No calcified gallstones. Limited for evaluating for gallbladder inflammation secondary to motion artifact. Pancreas: Motion degraded exam. No worrisome pancreatic mass or inflammation noted. Spleen: Motion degraded exam causes artifact near the hilum. Adrenals/Urinary Tract: Motion degraded exam. No obstructing stone or hydronephrosis. No worrisome renal mass. Right lower pole 7 mm nonobstructing stone. No worrisome adrenal lesion. Noncontrast filled views of the urinary bladder unremarkable. Stomach/Bowel: Significant sigmoid diverticula and muscular hypertrophy. Slightly engorged vessels within the sigmoid mesentery but without discrete evidence of diverticulitis. Other portions of bowel are limited by motion degradation however, no extraluminal bowel inflammatory process noted. Moderate-size hiatal hernia. Partially fluid-filled normal size small bowel loops. Vascular/Lymphatic: Prominent atherosclerotic changes abdominal aorta and aortic branch vessels. Infrarenal abdominal aortic aneurysm measuring up to 3.2 cm versus 3 cm on prior ultrasound. Prominent narrowing origin of the celiac artery, superior mesenteric artery, inferior mesenteric artery and renal arteries. Significant narrowing iliac arteries. Scattered  normal size lymph nodes. Reproductive: No worrisome uterine or adnexal mass. Other: No free intraperitoneal air or bowel containing hernia. Musculoskeletal: Remote left pubic body fracture with incomplete healing. Remote sacral fractures. Prominent scoliosis lower thoracic and lumbar spine convex left with superimposed degenerative changes. IMPRESSION: 1. Exam is motion degraded which limits evaluation for detection of an inflammatory process (particularly at the level of the gallbladder and upper abdomen). Taking this limitation into account, no discrete inflammatory process is noted. 2. Significant sigmoid diverticulosis with muscular hypertrophy. Slightly engorged vessels within the sigmoid mesentery but without discrete findings of diverticulitis. 3. Moderate size hiatal hernia. 4. Peribronchial thickening left lower lobe may represent chronic changes although can not exclude component of bronchitis. Basilar atelectasis/scarring. 5. Cardiomegaly.  Coronary artery calcification. 6. Aortic Atherosclerosis (ICD10-I70.0). Abdominal aortic aneurysm measures 3.2 cm versus prior 3 cm. Recommend followup by ultrasound in 3 years. This recommendation follows ACR consensus guidelines: White Paper of the ACR Incidental Findings Committee II on Vascular Findings. J Am Coll Radiol 2013; 22:297-989 7. Prominent narrowing origin of the celiac artery, superior mesenteric artery, inferior mesenteric artery and renal arteries. Significant narrowing iliac arteries. 8. Remote left pubic body fracture with incomplete healing. Remote sacral fractures. 9. Prominent scoliosis lumbar spine convex left with superimposed degenerative  changes. Electronically Signed   By: Genia Del M.D.   On: 01/11/2019 17:31     Not all labs, radiology exams or other studies done during hospitalization come through on my EPIC note; however they are reviewed by me.    Assessment and Plan  Bibasilar pneumonia- as evidenced by cough,  desaturation, and a chest x-ray that shows bilateral lower lobe infiltrates.  Patient is allergic to Avelox and prednisone; will treat with Rocephin 1 g IM daily for 3 days then [facility] for 4 days; also guaifenesin for cough; and continue O2 to keep saturation greater than 90%    Mary Hockey D. [name], MD

## 2019-02-01 ENCOUNTER — Encounter: Payer: Self-pay | Admitting: Internal Medicine

## 2019-02-04 ENCOUNTER — Non-Acute Institutional Stay (SKILLED_NURSING_FACILITY): Payer: Medicare Other | Admitting: Internal Medicine

## 2019-02-04 ENCOUNTER — Encounter: Payer: Self-pay | Admitting: Internal Medicine

## 2019-02-04 DIAGNOSIS — I480 Paroxysmal atrial fibrillation: Secondary | ICD-10-CM

## 2019-02-04 DIAGNOSIS — R6520 Severe sepsis without septic shock: Secondary | ICD-10-CM | POA: Diagnosis not present

## 2019-02-04 DIAGNOSIS — G9341 Metabolic encephalopathy: Secondary | ICD-10-CM

## 2019-02-04 DIAGNOSIS — G9340 Encephalopathy, unspecified: Secondary | ICD-10-CM

## 2019-02-04 DIAGNOSIS — J9601 Acute respiratory failure with hypoxia: Secondary | ICD-10-CM

## 2019-02-04 DIAGNOSIS — H04123 Dry eye syndrome of bilateral lacrimal glands: Secondary | ICD-10-CM

## 2019-02-04 DIAGNOSIS — A414 Sepsis due to anaerobes: Secondary | ICD-10-CM

## 2019-02-04 DIAGNOSIS — A4152 Sepsis due to Pseudomonas: Secondary | ICD-10-CM

## 2019-02-04 DIAGNOSIS — A4159 Other Gram-negative sepsis: Secondary | ICD-10-CM

## 2019-02-04 DIAGNOSIS — I10 Essential (primary) hypertension: Secondary | ICD-10-CM

## 2019-02-04 NOTE — Progress Notes (Signed)
Location:  [facility] Room Number: [phone] Place of Service:  SNF [phone])  Amanda Hinton. Amanda Coil, MD  Patient Care Team: Amanda Cruel, MD as PCP - General  Extended Emergency Contact Information Primary Emergency Contact: Amanda Hinton Address: [address]          [place], [place] [address] [place] Phone: [phone] Work Phone: [phone] Relation: Daughter  Allergies  Allergen Reactions   Avelox [Moxifloxacin Hcl In Nacl] Other (See Comments)    Prolonged QT wave   Prednisone Other (See Comments)    REACTION: Disoriented    Chief Complaint  Patient presents with   Discharge Note    Discharge from [facility]     HPI:  86 y.o. female with dementia, hyperlipidemia, COPD, osteoporosis, AAA 3.2 cm under surveillance, who presented to the ED with confusion since the morning of admission and incontinence of urine.  In the ED patient was noted to have O2 sat of 89%, was febrile to 103.3 with respiratory rate of 21 and a white count of 19.8 patient was admitted to [facility] from 2/20 4-29 where she was treated for Pseudomonas and Klebsiella sepsis and hypoxia requiring supplemental oxygen.  Hospital course was complicated by new onset atrial fib.  Patient was admitted to skilled nursing facility for OT/PT and is now ready to be discharged to [facility] memory care.    Past Medical History:  Diagnosis Date   AAA (abdominal aortic aneurysm) ([place]) 2014   COPD (chronic obstructive pulmonary disease) ([place])    Hyperlipemia    Hypertension    Osteoporosis     Past Surgical History:  Procedure Laterality Date   APPENDECTOMY  1968   biopsy of uterus  1968 and 1985   COLONOSCOPY  01/2010     reports that she quit smoking about 8 years ago. Her smoking use included cigarettes. She has a 54.00 pack-year smoking history. She has never used smokeless tobacco. She reports that she does not drink  alcohol or use drugs. Social History   Socioeconomic History   Marital status: Widowed    Spouse name: Not on file   Number of children: Not on file   Years of education: Not on file   Highest education level: Not on file  Occupational History   Occupation: retired    Comment: Designer, multimedia strain: Not on file   Food insecurity:    Worry: Not on file    Inability: Not on file   Transportation needs:    Medical: Not on file    Non-medical: Not on file  Tobacco Use   Smoking status: Former Smoker    Packs/day: 1.00    Years: 54.00    Pack years: 54.00    Types: Cigarettes    Last attempt to quit: 12/19/2010    Years since quitting: 8.1   Smokeless tobacco: Never Used   Tobacco comment: quit 2012  Substance and Sexual Activity   Alcohol use: No    Frequency: Never   Drug use: No   Sexual activity: Not on file  Lifestyle   Physical activity:    Days per week: Not on file    Minutes per session: Not on file   Stress: Not on file  Relationships   Social connections:    Talks on phone: Not on file    Gets together: Not on file    Attends religious  service: Not on file    Active member of club or organization: Not on file    Attends meetings of clubs or organizations: Not on file    Relationship status: Not on file   Intimate partner violence:    Fear of current or ex partner: Not on file    Emotionally abused: Not on file    Physically abused: Not on file    Forced sexual activity: Not on file  Other Topics Concern   Not on file  Social History Narrative   Not on file    Pertinent  Health Maintenance Due  Topic Date Due   INFLUENZA VACCINE  06/18/2018   PNA vac Low Risk Adult (1 of 2 - PCV13) 03/01/2019 (Originally 12/18/1996)   DEXA SCAN  Completed    Medications: Allergies as of 02/04/2019      Reactions   Avelox [moxifloxacin Hcl In Nacl] Other (See Comments)   Prolonged QT wave   Prednisone Other (See  Comments)   REACTION: Disoriented      Medication List       Accurate as of February 04, 2019 11:59 PM. Always use your most recent med list.        acetaminophen 500 MG tablet Commonly known as:  TYLENOL Take 2 tablets (1,000 mg total) by mouth at bedtime.   acetaminophen 325 MG tablet Commonly known as:  TYLENOL Take 2 tablets (650 mg total) by mouth every 6 (six) hours as needed for mild pain (or Fever >/= 101).   apixaban 2.5 MG Tabs tablet Commonly known as:  ELIQUIS Take 1 tablet (2.5 mg total) by mouth 2 (two) times daily.   aspirin 81 MG tablet Take 81 mg by mouth daily.   b complex vitamins tablet Take 1 tablet by mouth daily.   cycloSPORINE 0.05 % ophthalmic emulsion Commonly known as:  RESTASIS Place 1 drop into both eyes 2 (two) times daily.   diltiazem 120 MG 24 hr capsule Commonly known as:  Cardizem CD Take 1 capsule (120 mg total) by mouth every evening for 30 days.   docusate sodium 100 MG capsule Commonly known as:  COLACE Take 1 capsule (100 mg total) by mouth daily as needed for mild constipation.   donepezil 5 MG tablet Commonly known as:  ARICEPT Take 1 tablet (5 mg total) by mouth daily.   guaiFENesin 600 MG 12 hr tablet Commonly known as:  MUCINEX Take 1 tablet (600 mg total) by mouth 2 (two) times daily for 7 days.   KLOR-CON PO Take 10 mEq by mouth daily. 10 meq.   metoprolol tartrate 100 MG tablet Commonly known as:  LOPRESSOR Take 1 tablet (100 mg total) by mouth 2 (two) times daily.   multivitamin with minerals tablet Take 1 tablet by mouth daily.   polyethylene glycol packet Commonly known as:  MIRALAX / GLYCOLAX Take 17 g by mouth daily as needed for mild constipation.   potassium chloride 10 MEQ tablet Commonly known as:  K-DUR,KLOR-CON Take 1 tablet (10 mEq total) by mouth daily.   valACYclovir 500 MG tablet Commonly known as:  VALTREX Take 1 tablet (500 mg total) by mouth daily.        Vitals:   02/04/19 1454    BP: 140/62  Pulse: 60  Temp: (!) 97.2 F (36.2 C)  Weight: 130 lb (59 kg)  Height: 5\' 5"  (1.651 m)   Body mass index is 21.63 kg/m.  Physical Exam  GENERAL APPEARANCE: Alert, conversant. No  acute distress.  HEENT: Unremarkable. RESPIRATORY: Breathing is even, unlabored. Lung sounds are clear   CARDIOVASCULAR: Heart RRR no murmurs, rubs or gallops. No peripheral edema.  GASTROINTESTINAL: Abdomen is soft, non-tender, not distended w/ normal bowel sounds.  NEUROLOGIC: Cranial nerves 2-12 grossly intact. Moves all extremities   Labs reviewed: Basic Metabolic Panel: Recent Labs    01/12/19 0334  01/14/19 0347 01/15/19 0124 01/16/19 0326 01/20/19  NA 136   < > 135 134* 137 141  K 3.5   < > 3.6 3.4* 3.9 4.4  CL 105   < > 106 106 108  --   CO2 24   < > 21* 21* 23  --   GLUCOSE 111*   < > 108* 103* 95  --   BUN 19   < > 24* 22 20 16   CREATININE 0.85   < > 0.86 0.88 0.91 0.7  CALCIUM 8.1*   < > 8.3* 8.4* 8.2*  --   MG 1.7  --  1.9  --   --   --   PHOS 3.2  --   --   --   --   --    < > = values in this interval not displayed.   No results found for: [facility] Liver Function Tests: Recent Labs    01/11/19 1350 01/12/19 0334  AST 31 28  ALT 9 9  ALKPHOS 76 77  BILITOT 1.1 1.0  PROT 6.3* 5.4*  ALBUMIN 3.0* 2.6*   No results for input(s): LIPASE, AMYLASE in the last 8760 hours. No results for input(s): AMMONIA in the last 8760 hours. CBC: Recent Labs    05/16/18 1307 01/11/19 1350  01/14/19 0347 01/15/19 0124 01/16/19 0326 01/20/19  WBC 13.5* 19.8*   < > 16.6* 12.8* 11.4* 14.9  NEUTROABS 11.1* 18.6*  --   --   --   --   --   HGB 13.0 11.8*   < > 10.8* 10.4* 10.8* 11.1*  HCT 40.6 37.6   < > 33.3* 32.4* 34.4* 34*  MCV 86.9 88.9   < > 84.5 85.5 87.1  --   PLT 312 211   < > 242 280 319 587*   < > = values in this interval not displayed.   Lipid No results for input(s): CHOL, HDL, LDLCALC, TRIG in the last 8760 hours. Cardiac Enzymes: No results for input(s):  CKTOTAL, CKMB, CKMBINDEX, TROPONINI in the last 8760 hours. BNP: No results for input(s): BNP in the last 8760 hours. CBG: No results for input(s): GLUCAP in the last 8760 hours.  Procedures and Imaging Studies During Stay: Dg Chest 2 View  Result Date: 01/11/2019 CLINICAL DATA:  Altered mental status, incontinence and possible pneumonia. EXAM: CHEST - 2 VIEW COMPARISON:  11/07/2015 FINDINGS: The heart size is at the upper limits of normal. There may be a subtle right upper lobe infiltrate. Chronic lung disease present with potential mild interstitial edema. No pleural effusions or evidence of pneumothorax. IMPRESSION: Possible subtle right upper lobe infiltrate. Chronic lung disease with potential mild interstitial edema. Electronically Signed   By: Aletta Edouard M.D.   On: 01/11/2019 16:14   Ct Abdomen Pelvis W Contrast  Result Date: 01/11/2019 CLINICAL DATA:  85 year old female with confusion. Fever. Known aneurysm which is stable. Prior appendectomy. Initial encounter. EXAM: CT ABDOMEN AND PELVIS WITH CONTRAST TECHNIQUE: Multidetector CT imaging of the abdomen and pelvis was performed using the standard protocol following bolus administration of intravenous contrast. CONTRAST:  145mL OMNIPAQUE  IOHEXOL 300 MG/ML  SOLN COMPARISON:  Pelvic CT 07/07/2018. 06/16/2017 abdominal aortic ultrasound. FINDINGS: Lower chest: Basilar parenchymal changes suggestive of scarring/atelectasis. Peribronchial thickening left lower lobe may represent chronic changes although can not exclude component of bronchitis. Left base small calcified granuloma. Cardiomegaly. Mitral valve calcification. Coronary artery calcification. Hepatobiliary: Motion degraded exam. No worrisome hepatic lesion. No calcified gallstones. Limited for evaluating for gallbladder inflammation secondary to motion artifact. Pancreas: Motion degraded exam. No worrisome pancreatic mass or inflammation noted. Spleen: Motion degraded exam causes  artifact near the hilum. Adrenals/Urinary Tract: Motion degraded exam. No obstructing stone or hydronephrosis. No worrisome renal mass. Right lower pole 7 mm nonobstructing stone. No worrisome adrenal lesion. Noncontrast filled views of the urinary bladder unremarkable. Stomach/Bowel: Significant sigmoid diverticula and muscular hypertrophy. Slightly engorged vessels within the sigmoid mesentery but without discrete evidence of diverticulitis. Other portions of bowel are limited by motion degradation however, no extraluminal bowel inflammatory process noted. Moderate-size hiatal hernia. Partially fluid-filled normal size small bowel loops. Vascular/Lymphatic: Prominent atherosclerotic changes abdominal aorta and aortic branch vessels. Infrarenal abdominal aortic aneurysm measuring up to 3.2 cm versus 3 cm on prior ultrasound. Prominent narrowing origin of the celiac artery, superior mesenteric artery, inferior mesenteric artery and renal arteries. Significant narrowing iliac arteries. Scattered normal size lymph nodes. Reproductive: No worrisome uterine or adnexal mass. Other: No free intraperitoneal air or bowel containing hernia. Musculoskeletal: Remote left pubic body fracture with incomplete healing. Remote sacral fractures. Prominent scoliosis lower thoracic and lumbar spine convex left with superimposed degenerative changes. IMPRESSION: 1. Exam is motion degraded which limits evaluation for detection of an inflammatory process (particularly at the level of the gallbladder and upper abdomen). Taking this limitation into account, no discrete inflammatory process is noted. 2. Significant sigmoid diverticulosis with muscular hypertrophy. Slightly engorged vessels within the sigmoid mesentery but without discrete findings of diverticulitis. 3. Moderate size hiatal hernia. 4. Peribronchial thickening left lower lobe may represent chronic changes although can not exclude component of bronchitis. Basilar  atelectasis/scarring. 5. Cardiomegaly.  Coronary artery calcification. 6. Aortic Atherosclerosis (ICD10-I70.0). Abdominal aortic aneurysm measures 3.2 cm versus prior 3 cm. Recommend followup by ultrasound in 3 years. This recommendation follows ACR consensus guidelines: White Paper of the ACR Incidental Findings Committee II on Vascular Findings. J Am Coll Radiol 2013; 09:470-962 7. Prominent narrowing origin of the celiac artery, superior mesenteric artery, inferior mesenteric artery and renal arteries. Significant narrowing iliac arteries. 8. Remote left pubic body fracture with incomplete healing. Remote sacral fractures. 9. Prominent scoliosis lumbar spine convex left with superimposed degenerative changes. Electronically Signed   By: Genia Del M.D.   On: 01/11/2019 17:31   Dg Chest Port 1v Same Day  Result Date: 01/15/2019 CLINICAL DATA:  Status post PICC placement. EXAM: PORTABLE CHEST 1 VIEW COMPARISON:  01/11/2019 FINDINGS: A right PICC has been placed and terminates over the mid to lower SVC. The cardiac silhouette is mildly enlarged. The interstitial markings remain increased diffusely. Patchy right midlung opacity on the prior study has largely resolved. No pleural effusion or pneumothorax is identified. IMPRESSION: 1. Right PICC terminates over the mid to lower SVC. 2. Persistent increased interstitial markings diffusely which may reflect chronic lung disease with possible superimposed edema or atypical infection. 3. Largely resolved right midlung infiltrate. Electronically Signed   By: Logan Bores M.D.   On: 01/15/2019 17:58   [place] Ekg Site Rite  Result Date: 01/14/2019 If Site Rite image not attached, placement could not be confirmed due to current cardiac  rhythm.   Assessment/Plan:   Sepsis due to Pseudomonas species with encephalopathy without septic shock (HCC)  Klebsiella sepsis (HCC)  Acute encephalopathy  Acute respiratory failure with hypoxia (HCC)  Paroxysmal atrial  fibrillation (HCC)  Essential hypertension  Dry eye syndrome of both eyes   Patient is being discharged with the following home health services: OT/PT/nursing  Patient is being discharged with the following durable medical equipment: Rolling walker  Patient has been advised to f/u with their PCP in 1-2 weeks to bring them up to date on their rehab stay.  Social services at facility was responsible for arranging this appointment.  Pt was provided with a 30 day supply of prescriptions for medications and refills must be obtained from their PCP.  For controlled substances, a more limited supply may be provided adequate until PCP appointment only.  Medications have been reconciled.  Time spent than 30 minutes;> 50% of time with patient was spent reviewing records, labs, tests and studies, counseling and developing plan of care  Amanda Hinton. Amanda Coil, MD

## 2019-02-05 MED ORDER — CYCLOSPORINE 0.05 % OP EMUL: 1.0000 [drp] | Freq: Two times a day (BID) | OPHTHALMIC | 0 refills | Status: AC

## 2019-02-05 MED ORDER — GUAIFENESIN ER 600 MG PO TB12: 600.0000 mg | ORAL_TABLET | Freq: Two times a day (BID) | ORAL | 0 refills | Status: AC

## 2019-02-05 MED ORDER — DOCUSATE SODIUM 100 MG PO CAPS: 100.0000 mg | ORAL_CAPSULE | Freq: Every day | ORAL | 0 refills | Status: AC | PRN

## 2019-02-05 MED ORDER — VALACYCLOVIR HCL 500 MG PO TABS: 500.0000 mg | ORAL_TABLET | Freq: Every day | ORAL | 0 refills | Status: AC

## 2019-02-05 MED ORDER — POTASSIUM CHLORIDE CRYS ER 10 MEQ PO TBCR: 10.0000 meq | EXTENDED_RELEASE_TABLET | Freq: Every day | ORAL | 0 refills | Status: AC

## 2019-02-05 MED ORDER — DONEPEZIL HCL 5 MG PO TABS: 5.0000 mg | ORAL_TABLET | Freq: Every day | ORAL | 0 refills | Status: AC

## 2019-02-05 MED ORDER — POLYETHYLENE GLYCOL 3350 17 G PO PACK: 17.0000 g | PACK | Freq: Every day | ORAL | 0 refills | Status: AC | PRN

## 2019-02-05 MED ORDER — APIXABAN 2.5 MG PO TABS: 2.5000 mg | ORAL_TABLET | Freq: Two times a day (BID) | ORAL | 0 refills | Status: AC

## 2019-02-05 MED ORDER — DILTIAZEM HCL ER COATED BEADS 120 MG PO CP24: 120.0000 mg | ORAL_CAPSULE | Freq: Every evening | ORAL | 0 refills | Status: AC

## 2019-02-05 MED ORDER — METOPROLOL TARTRATE 100 MG PO TABS: 100.0000 mg | ORAL_TABLET | Freq: Two times a day (BID) | ORAL | 0 refills | Status: AC

## 2019-02-06 ENCOUNTER — Encounter: Payer: Self-pay | Admitting: Internal Medicine

## 2019-02-08 DIAGNOSIS — Z7901 Long term (current) use of anticoagulants: Secondary | ICD-10-CM | POA: Diagnosis not present

## 2019-02-08 DIAGNOSIS — M810 Age-related osteoporosis without current pathological fracture: Secondary | ICD-10-CM | POA: Diagnosis not present

## 2019-02-08 DIAGNOSIS — Z87891 Personal history of nicotine dependence: Secondary | ICD-10-CM | POA: Diagnosis not present

## 2019-02-08 DIAGNOSIS — M4126 Other idiopathic scoliosis, lumbar region: Secondary | ICD-10-CM | POA: Diagnosis not present

## 2019-02-08 DIAGNOSIS — Z7982 Long term (current) use of aspirin: Secondary | ICD-10-CM | POA: Diagnosis not present

## 2019-02-08 DIAGNOSIS — I10 Essential (primary) hypertension: Secondary | ICD-10-CM | POA: Diagnosis not present

## 2019-02-08 DIAGNOSIS — F0390 Unspecified dementia without behavioral disturbance: Secondary | ICD-10-CM | POA: Diagnosis not present

## 2019-02-08 DIAGNOSIS — I714 Abdominal aortic aneurysm, without rupture: Secondary | ICD-10-CM | POA: Diagnosis not present

## 2019-02-08 DIAGNOSIS — J449 Chronic obstructive pulmonary disease, unspecified: Secondary | ICD-10-CM | POA: Diagnosis not present

## 2019-02-08 DIAGNOSIS — I480 Paroxysmal atrial fibrillation: Secondary | ICD-10-CM | POA: Diagnosis not present

## 2019-02-09 DIAGNOSIS — Z79899 Other long term (current) drug therapy: Secondary | ICD-10-CM | POA: Diagnosis not present

## 2019-02-09 DIAGNOSIS — R269 Unspecified abnormalities of gait and mobility: Secondary | ICD-10-CM | POA: Diagnosis not present

## 2019-02-09 DIAGNOSIS — I4891 Unspecified atrial fibrillation: Secondary | ICD-10-CM | POA: Diagnosis not present

## 2019-02-09 DIAGNOSIS — J449 Chronic obstructive pulmonary disease, unspecified: Secondary | ICD-10-CM | POA: Diagnosis not present

## 2019-02-09 DIAGNOSIS — F0390 Unspecified dementia without behavioral disturbance: Secondary | ICD-10-CM | POA: Diagnosis not present

## 2019-02-10 DIAGNOSIS — R05 Cough: Secondary | ICD-10-CM | POA: Diagnosis not present

## 2019-02-12 DIAGNOSIS — I10 Essential (primary) hypertension: Secondary | ICD-10-CM | POA: Diagnosis not present

## 2019-02-12 DIAGNOSIS — F0390 Unspecified dementia without behavioral disturbance: Secondary | ICD-10-CM | POA: Diagnosis not present

## 2019-02-12 DIAGNOSIS — J449 Chronic obstructive pulmonary disease, unspecified: Secondary | ICD-10-CM | POA: Diagnosis not present

## 2019-02-12 DIAGNOSIS — I480 Paroxysmal atrial fibrillation: Secondary | ICD-10-CM | POA: Diagnosis not present

## 2019-02-12 DIAGNOSIS — M4126 Other idiopathic scoliosis, lumbar region: Secondary | ICD-10-CM | POA: Diagnosis not present

## 2019-02-12 DIAGNOSIS — M810 Age-related osteoporosis without current pathological fracture: Secondary | ICD-10-CM | POA: Diagnosis not present

## 2019-02-16 DIAGNOSIS — I10 Essential (primary) hypertension: Secondary | ICD-10-CM | POA: Diagnosis not present

## 2019-02-16 DIAGNOSIS — M810 Age-related osteoporosis without current pathological fracture: Secondary | ICD-10-CM | POA: Diagnosis not present

## 2019-02-16 DIAGNOSIS — I480 Paroxysmal atrial fibrillation: Secondary | ICD-10-CM | POA: Diagnosis not present

## 2019-02-16 DIAGNOSIS — M4126 Other idiopathic scoliosis, lumbar region: Secondary | ICD-10-CM | POA: Diagnosis not present

## 2019-02-16 DIAGNOSIS — J449 Chronic obstructive pulmonary disease, unspecified: Secondary | ICD-10-CM | POA: Diagnosis not present

## 2019-02-16 DIAGNOSIS — F0390 Unspecified dementia without behavioral disturbance: Secondary | ICD-10-CM | POA: Diagnosis not present

## 2019-02-17 DIAGNOSIS — I480 Paroxysmal atrial fibrillation: Secondary | ICD-10-CM | POA: Diagnosis not present

## 2019-02-17 DIAGNOSIS — J449 Chronic obstructive pulmonary disease, unspecified: Secondary | ICD-10-CM | POA: Diagnosis not present

## 2019-02-17 DIAGNOSIS — F0390 Unspecified dementia without behavioral disturbance: Secondary | ICD-10-CM | POA: Diagnosis not present

## 2019-02-17 DIAGNOSIS — M810 Age-related osteoporosis without current pathological fracture: Secondary | ICD-10-CM | POA: Diagnosis not present

## 2019-02-17 DIAGNOSIS — M4126 Other idiopathic scoliosis, lumbar region: Secondary | ICD-10-CM | POA: Diagnosis not present

## 2019-02-17 DIAGNOSIS — I10 Essential (primary) hypertension: Secondary | ICD-10-CM | POA: Diagnosis not present

## 2019-02-18 DIAGNOSIS — Z79899 Other long term (current) drug therapy: Secondary | ICD-10-CM | POA: Diagnosis not present

## 2019-02-19 DIAGNOSIS — F0390 Unspecified dementia without behavioral disturbance: Secondary | ICD-10-CM | POA: Diagnosis not present

## 2019-02-19 DIAGNOSIS — M4126 Other idiopathic scoliosis, lumbar region: Secondary | ICD-10-CM | POA: Diagnosis not present

## 2019-02-19 DIAGNOSIS — J449 Chronic obstructive pulmonary disease, unspecified: Secondary | ICD-10-CM | POA: Diagnosis not present

## 2019-02-19 DIAGNOSIS — M810 Age-related osteoporosis without current pathological fracture: Secondary | ICD-10-CM | POA: Diagnosis not present

## 2019-02-19 DIAGNOSIS — I480 Paroxysmal atrial fibrillation: Secondary | ICD-10-CM | POA: Diagnosis not present

## 2019-02-19 DIAGNOSIS — I10 Essential (primary) hypertension: Secondary | ICD-10-CM | POA: Diagnosis not present

## 2019-02-22 DIAGNOSIS — I480 Paroxysmal atrial fibrillation: Secondary | ICD-10-CM | POA: Diagnosis not present

## 2019-02-22 DIAGNOSIS — F0390 Unspecified dementia without behavioral disturbance: Secondary | ICD-10-CM | POA: Diagnosis not present

## 2019-02-22 DIAGNOSIS — M4126 Other idiopathic scoliosis, lumbar region: Secondary | ICD-10-CM | POA: Diagnosis not present

## 2019-02-22 DIAGNOSIS — J449 Chronic obstructive pulmonary disease, unspecified: Secondary | ICD-10-CM | POA: Diagnosis not present

## 2019-02-22 DIAGNOSIS — I10 Essential (primary) hypertension: Secondary | ICD-10-CM | POA: Diagnosis not present

## 2019-02-22 DIAGNOSIS — M810 Age-related osteoporosis without current pathological fracture: Secondary | ICD-10-CM | POA: Diagnosis not present

## 2019-02-23 DIAGNOSIS — F0390 Unspecified dementia without behavioral disturbance: Secondary | ICD-10-CM | POA: Diagnosis not present

## 2019-02-23 DIAGNOSIS — M4126 Other idiopathic scoliosis, lumbar region: Secondary | ICD-10-CM | POA: Diagnosis not present

## 2019-02-23 DIAGNOSIS — J449 Chronic obstructive pulmonary disease, unspecified: Secondary | ICD-10-CM | POA: Diagnosis not present

## 2019-02-23 DIAGNOSIS — I480 Paroxysmal atrial fibrillation: Secondary | ICD-10-CM | POA: Diagnosis not present

## 2019-02-23 DIAGNOSIS — I10 Essential (primary) hypertension: Secondary | ICD-10-CM | POA: Diagnosis not present

## 2019-02-23 DIAGNOSIS — M810 Age-related osteoporosis without current pathological fracture: Secondary | ICD-10-CM | POA: Diagnosis not present

## 2019-02-25 DIAGNOSIS — M810 Age-related osteoporosis without current pathological fracture: Secondary | ICD-10-CM | POA: Diagnosis not present

## 2019-02-25 DIAGNOSIS — F0390 Unspecified dementia without behavioral disturbance: Secondary | ICD-10-CM | POA: Diagnosis not present

## 2019-02-25 DIAGNOSIS — J449 Chronic obstructive pulmonary disease, unspecified: Secondary | ICD-10-CM | POA: Diagnosis not present

## 2019-02-25 DIAGNOSIS — I480 Paroxysmal atrial fibrillation: Secondary | ICD-10-CM | POA: Diagnosis not present

## 2019-02-25 DIAGNOSIS — I10 Essential (primary) hypertension: Secondary | ICD-10-CM | POA: Diagnosis not present

## 2019-02-25 DIAGNOSIS — M4126 Other idiopathic scoliosis, lumbar region: Secondary | ICD-10-CM | POA: Diagnosis not present

## 2019-02-26 DIAGNOSIS — J449 Chronic obstructive pulmonary disease, unspecified: Secondary | ICD-10-CM | POA: Diagnosis not present

## 2019-02-26 DIAGNOSIS — M4126 Other idiopathic scoliosis, lumbar region: Secondary | ICD-10-CM | POA: Diagnosis not present

## 2019-02-26 DIAGNOSIS — I10 Essential (primary) hypertension: Secondary | ICD-10-CM | POA: Diagnosis not present

## 2019-02-26 DIAGNOSIS — M810 Age-related osteoporosis without current pathological fracture: Secondary | ICD-10-CM | POA: Diagnosis not present

## 2019-02-26 DIAGNOSIS — I480 Paroxysmal atrial fibrillation: Secondary | ICD-10-CM | POA: Diagnosis not present

## 2019-02-26 DIAGNOSIS — F0390 Unspecified dementia without behavioral disturbance: Secondary | ICD-10-CM | POA: Diagnosis not present

## 2019-03-01 DIAGNOSIS — M810 Age-related osteoporosis without current pathological fracture: Secondary | ICD-10-CM | POA: Diagnosis not present

## 2019-03-01 DIAGNOSIS — F0390 Unspecified dementia without behavioral disturbance: Secondary | ICD-10-CM | POA: Diagnosis not present

## 2019-03-01 DIAGNOSIS — J449 Chronic obstructive pulmonary disease, unspecified: Secondary | ICD-10-CM | POA: Diagnosis not present

## 2019-03-01 DIAGNOSIS — I480 Paroxysmal atrial fibrillation: Secondary | ICD-10-CM | POA: Diagnosis not present

## 2019-03-01 DIAGNOSIS — I10 Essential (primary) hypertension: Secondary | ICD-10-CM | POA: Diagnosis not present

## 2019-03-01 DIAGNOSIS — M4126 Other idiopathic scoliosis, lumbar region: Secondary | ICD-10-CM | POA: Diagnosis not present

## 2019-03-02 DIAGNOSIS — M160 Bilateral primary osteoarthritis of hip: Secondary | ICD-10-CM | POA: Diagnosis not present

## 2019-03-02 DIAGNOSIS — L89159 Pressure ulcer of sacral region, unspecified stage: Secondary | ICD-10-CM | POA: Diagnosis not present

## 2019-03-02 DIAGNOSIS — Z79899 Other long term (current) drug therapy: Secondary | ICD-10-CM | POA: Diagnosis not present

## 2019-03-02 DIAGNOSIS — F0390 Unspecified dementia without behavioral disturbance: Secondary | ICD-10-CM | POA: Diagnosis not present

## 2019-03-02 DIAGNOSIS — R269 Unspecified abnormalities of gait and mobility: Secondary | ICD-10-CM | POA: Diagnosis not present

## 2019-03-02 DIAGNOSIS — I4891 Unspecified atrial fibrillation: Secondary | ICD-10-CM | POA: Diagnosis not present

## 2019-03-02 DIAGNOSIS — J449 Chronic obstructive pulmonary disease, unspecified: Secondary | ICD-10-CM | POA: Diagnosis not present

## 2019-03-04 DIAGNOSIS — J449 Chronic obstructive pulmonary disease, unspecified: Secondary | ICD-10-CM | POA: Diagnosis not present

## 2019-03-04 DIAGNOSIS — I480 Paroxysmal atrial fibrillation: Secondary | ICD-10-CM | POA: Diagnosis not present

## 2019-03-04 DIAGNOSIS — M4126 Other idiopathic scoliosis, lumbar region: Secondary | ICD-10-CM | POA: Diagnosis not present

## 2019-03-04 DIAGNOSIS — M810 Age-related osteoporosis without current pathological fracture: Secondary | ICD-10-CM | POA: Diagnosis not present

## 2019-03-04 DIAGNOSIS — F0390 Unspecified dementia without behavioral disturbance: Secondary | ICD-10-CM | POA: Diagnosis not present

## 2019-03-04 DIAGNOSIS — I10 Essential (primary) hypertension: Secondary | ICD-10-CM | POA: Diagnosis not present

## 2019-03-05 DIAGNOSIS — M810 Age-related osteoporosis without current pathological fracture: Secondary | ICD-10-CM | POA: Diagnosis not present

## 2019-03-05 DIAGNOSIS — F0390 Unspecified dementia without behavioral disturbance: Secondary | ICD-10-CM | POA: Diagnosis not present

## 2019-03-05 DIAGNOSIS — Z20828 Contact with and (suspected) exposure to other viral communicable diseases: Secondary | ICD-10-CM | POA: Diagnosis not present

## 2019-03-05 DIAGNOSIS — M4126 Other idiopathic scoliosis, lumbar region: Secondary | ICD-10-CM | POA: Diagnosis not present

## 2019-03-05 DIAGNOSIS — I480 Paroxysmal atrial fibrillation: Secondary | ICD-10-CM | POA: Diagnosis not present

## 2019-03-05 DIAGNOSIS — I10 Essential (primary) hypertension: Secondary | ICD-10-CM | POA: Diagnosis not present

## 2019-03-05 DIAGNOSIS — J449 Chronic obstructive pulmonary disease, unspecified: Secondary | ICD-10-CM | POA: Diagnosis not present

## 2019-03-09 DIAGNOSIS — R269 Unspecified abnormalities of gait and mobility: Secondary | ICD-10-CM | POA: Diagnosis not present

## 2019-03-09 DIAGNOSIS — I4891 Unspecified atrial fibrillation: Secondary | ICD-10-CM | POA: Diagnosis not present

## 2019-03-09 DIAGNOSIS — I480 Paroxysmal atrial fibrillation: Secondary | ICD-10-CM | POA: Diagnosis not present

## 2019-03-09 DIAGNOSIS — M4126 Other idiopathic scoliosis, lumbar region: Secondary | ICD-10-CM | POA: Diagnosis not present

## 2019-03-09 DIAGNOSIS — I10 Essential (primary) hypertension: Secondary | ICD-10-CM | POA: Diagnosis not present

## 2019-03-09 DIAGNOSIS — M810 Age-related osteoporosis without current pathological fracture: Secondary | ICD-10-CM | POA: Diagnosis not present

## 2019-03-09 DIAGNOSIS — F0390 Unspecified dementia without behavioral disturbance: Secondary | ICD-10-CM | POA: Diagnosis not present

## 2019-03-09 DIAGNOSIS — Z79899 Other long term (current) drug therapy: Secondary | ICD-10-CM | POA: Diagnosis not present

## 2019-03-09 DIAGNOSIS — I714 Abdominal aortic aneurysm, without rupture: Secondary | ICD-10-CM | POA: Diagnosis not present

## 2019-03-09 DIAGNOSIS — J449 Chronic obstructive pulmonary disease, unspecified: Secondary | ICD-10-CM | POA: Diagnosis not present

## 2019-03-10 DIAGNOSIS — F0390 Unspecified dementia without behavioral disturbance: Secondary | ICD-10-CM | POA: Diagnosis not present

## 2019-03-10 DIAGNOSIS — M810 Age-related osteoporosis without current pathological fracture: Secondary | ICD-10-CM | POA: Diagnosis not present

## 2019-03-10 DIAGNOSIS — M4126 Other idiopathic scoliosis, lumbar region: Secondary | ICD-10-CM | POA: Diagnosis not present

## 2019-03-10 DIAGNOSIS — I714 Abdominal aortic aneurysm, without rupture: Secondary | ICD-10-CM | POA: Diagnosis not present

## 2019-03-10 DIAGNOSIS — I480 Paroxysmal atrial fibrillation: Secondary | ICD-10-CM | POA: Diagnosis not present

## 2019-03-10 DIAGNOSIS — Z7982 Long term (current) use of aspirin: Secondary | ICD-10-CM | POA: Diagnosis not present

## 2019-03-10 DIAGNOSIS — Z87891 Personal history of nicotine dependence: Secondary | ICD-10-CM | POA: Diagnosis not present

## 2019-03-10 DIAGNOSIS — J449 Chronic obstructive pulmonary disease, unspecified: Secondary | ICD-10-CM | POA: Diagnosis not present

## 2019-03-10 DIAGNOSIS — I10 Essential (primary) hypertension: Secondary | ICD-10-CM | POA: Diagnosis not present

## 2019-03-10 DIAGNOSIS — Z7901 Long term (current) use of anticoagulants: Secondary | ICD-10-CM | POA: Diagnosis not present

## 2019-03-12 DIAGNOSIS — I480 Paroxysmal atrial fibrillation: Secondary | ICD-10-CM | POA: Diagnosis not present

## 2019-03-12 DIAGNOSIS — M4126 Other idiopathic scoliosis, lumbar region: Secondary | ICD-10-CM | POA: Diagnosis not present

## 2019-03-12 DIAGNOSIS — I10 Essential (primary) hypertension: Secondary | ICD-10-CM | POA: Diagnosis not present

## 2019-03-12 DIAGNOSIS — J449 Chronic obstructive pulmonary disease, unspecified: Secondary | ICD-10-CM | POA: Diagnosis not present

## 2019-03-12 DIAGNOSIS — F0390 Unspecified dementia without behavioral disturbance: Secondary | ICD-10-CM | POA: Diagnosis not present

## 2019-03-12 DIAGNOSIS — M810 Age-related osteoporosis without current pathological fracture: Secondary | ICD-10-CM | POA: Diagnosis not present

## 2019-03-16 DIAGNOSIS — F0390 Unspecified dementia without behavioral disturbance: Secondary | ICD-10-CM | POA: Diagnosis not present

## 2019-03-16 DIAGNOSIS — I480 Paroxysmal atrial fibrillation: Secondary | ICD-10-CM | POA: Diagnosis not present

## 2019-03-16 DIAGNOSIS — I10 Essential (primary) hypertension: Secondary | ICD-10-CM | POA: Diagnosis not present

## 2019-03-16 DIAGNOSIS — M4126 Other idiopathic scoliosis, lumbar region: Secondary | ICD-10-CM | POA: Diagnosis not present

## 2019-03-16 DIAGNOSIS — M810 Age-related osteoporosis without current pathological fracture: Secondary | ICD-10-CM | POA: Diagnosis not present

## 2019-03-16 DIAGNOSIS — J449 Chronic obstructive pulmonary disease, unspecified: Secondary | ICD-10-CM | POA: Diagnosis not present

## 2019-03-17 DIAGNOSIS — J449 Chronic obstructive pulmonary disease, unspecified: Secondary | ICD-10-CM | POA: Diagnosis not present

## 2019-03-17 DIAGNOSIS — M810 Age-related osteoporosis without current pathological fracture: Secondary | ICD-10-CM | POA: Diagnosis not present

## 2019-03-17 DIAGNOSIS — F0390 Unspecified dementia without behavioral disturbance: Secondary | ICD-10-CM | POA: Diagnosis not present

## 2019-03-17 DIAGNOSIS — I10 Essential (primary) hypertension: Secondary | ICD-10-CM | POA: Diagnosis not present

## 2019-03-17 DIAGNOSIS — I480 Paroxysmal atrial fibrillation: Secondary | ICD-10-CM | POA: Diagnosis not present

## 2019-03-17 DIAGNOSIS — M4126 Other idiopathic scoliosis, lumbar region: Secondary | ICD-10-CM | POA: Diagnosis not present

## 2019-03-18 DIAGNOSIS — M4126 Other idiopathic scoliosis, lumbar region: Secondary | ICD-10-CM | POA: Diagnosis not present

## 2019-03-18 DIAGNOSIS — I480 Paroxysmal atrial fibrillation: Secondary | ICD-10-CM | POA: Diagnosis not present

## 2019-03-18 DIAGNOSIS — F0390 Unspecified dementia without behavioral disturbance: Secondary | ICD-10-CM | POA: Diagnosis not present

## 2019-03-18 DIAGNOSIS — I10 Essential (primary) hypertension: Secondary | ICD-10-CM | POA: Diagnosis not present

## 2019-03-18 DIAGNOSIS — J449 Chronic obstructive pulmonary disease, unspecified: Secondary | ICD-10-CM | POA: Diagnosis not present

## 2019-03-18 DIAGNOSIS — M810 Age-related osteoporosis without current pathological fracture: Secondary | ICD-10-CM | POA: Diagnosis not present

## 2019-03-19 DIAGNOSIS — J449 Chronic obstructive pulmonary disease, unspecified: Secondary | ICD-10-CM | POA: Diagnosis not present

## 2019-03-19 DIAGNOSIS — F0390 Unspecified dementia without behavioral disturbance: Secondary | ICD-10-CM | POA: Diagnosis not present

## 2019-03-19 DIAGNOSIS — M4126 Other idiopathic scoliosis, lumbar region: Secondary | ICD-10-CM | POA: Diagnosis not present

## 2019-03-19 DIAGNOSIS — I10 Essential (primary) hypertension: Secondary | ICD-10-CM | POA: Diagnosis not present

## 2019-03-19 DIAGNOSIS — I480 Paroxysmal atrial fibrillation: Secondary | ICD-10-CM | POA: Diagnosis not present

## 2019-03-19 DIAGNOSIS — M810 Age-related osteoporosis without current pathological fracture: Secondary | ICD-10-CM | POA: Diagnosis not present

## 2019-03-24 DIAGNOSIS — M4126 Other idiopathic scoliosis, lumbar region: Secondary | ICD-10-CM | POA: Diagnosis not present

## 2019-03-24 DIAGNOSIS — I10 Essential (primary) hypertension: Secondary | ICD-10-CM | POA: Diagnosis not present

## 2019-03-24 DIAGNOSIS — M810 Age-related osteoporosis without current pathological fracture: Secondary | ICD-10-CM | POA: Diagnosis not present

## 2019-03-24 DIAGNOSIS — J449 Chronic obstructive pulmonary disease, unspecified: Secondary | ICD-10-CM | POA: Diagnosis not present

## 2019-03-24 DIAGNOSIS — I480 Paroxysmal atrial fibrillation: Secondary | ICD-10-CM | POA: Diagnosis not present

## 2019-03-24 DIAGNOSIS — F0390 Unspecified dementia without behavioral disturbance: Secondary | ICD-10-CM | POA: Diagnosis not present

## 2019-03-25 DIAGNOSIS — I10 Essential (primary) hypertension: Secondary | ICD-10-CM | POA: Diagnosis not present

## 2019-03-25 DIAGNOSIS — M810 Age-related osteoporosis without current pathological fracture: Secondary | ICD-10-CM | POA: Diagnosis not present

## 2019-03-25 DIAGNOSIS — M4126 Other idiopathic scoliosis, lumbar region: Secondary | ICD-10-CM | POA: Diagnosis not present

## 2019-03-25 DIAGNOSIS — I480 Paroxysmal atrial fibrillation: Secondary | ICD-10-CM | POA: Diagnosis not present

## 2019-03-25 DIAGNOSIS — J449 Chronic obstructive pulmonary disease, unspecified: Secondary | ICD-10-CM | POA: Diagnosis not present

## 2019-03-25 DIAGNOSIS — F0390 Unspecified dementia without behavioral disturbance: Secondary | ICD-10-CM | POA: Diagnosis not present

## 2019-03-26 DIAGNOSIS — I480 Paroxysmal atrial fibrillation: Secondary | ICD-10-CM | POA: Diagnosis not present

## 2019-03-26 DIAGNOSIS — M810 Age-related osteoporosis without current pathological fracture: Secondary | ICD-10-CM | POA: Diagnosis not present

## 2019-03-26 DIAGNOSIS — I10 Essential (primary) hypertension: Secondary | ICD-10-CM | POA: Diagnosis not present

## 2019-03-26 DIAGNOSIS — F0390 Unspecified dementia without behavioral disturbance: Secondary | ICD-10-CM | POA: Diagnosis not present

## 2019-03-26 DIAGNOSIS — J449 Chronic obstructive pulmonary disease, unspecified: Secondary | ICD-10-CM | POA: Diagnosis not present

## 2019-03-26 DIAGNOSIS — M4126 Other idiopathic scoliosis, lumbar region: Secondary | ICD-10-CM | POA: Diagnosis not present

## 2019-03-29 DIAGNOSIS — J449 Chronic obstructive pulmonary disease, unspecified: Secondary | ICD-10-CM | POA: Diagnosis not present

## 2019-03-29 DIAGNOSIS — F0390 Unspecified dementia without behavioral disturbance: Secondary | ICD-10-CM | POA: Diagnosis not present

## 2019-03-29 DIAGNOSIS — I10 Essential (primary) hypertension: Secondary | ICD-10-CM | POA: Diagnosis not present

## 2019-03-29 DIAGNOSIS — M810 Age-related osteoporosis without current pathological fracture: Secondary | ICD-10-CM | POA: Diagnosis not present

## 2019-03-29 DIAGNOSIS — I480 Paroxysmal atrial fibrillation: Secondary | ICD-10-CM | POA: Diagnosis not present

## 2019-03-29 DIAGNOSIS — M4126 Other idiopathic scoliosis, lumbar region: Secondary | ICD-10-CM | POA: Diagnosis not present

## 2019-03-31 DIAGNOSIS — I480 Paroxysmal atrial fibrillation: Secondary | ICD-10-CM | POA: Diagnosis not present

## 2019-03-31 DIAGNOSIS — M810 Age-related osteoporosis without current pathological fracture: Secondary | ICD-10-CM | POA: Diagnosis not present

## 2019-03-31 DIAGNOSIS — I10 Essential (primary) hypertension: Secondary | ICD-10-CM | POA: Diagnosis not present

## 2019-03-31 DIAGNOSIS — J449 Chronic obstructive pulmonary disease, unspecified: Secondary | ICD-10-CM | POA: Diagnosis not present

## 2019-03-31 DIAGNOSIS — F0390 Unspecified dementia without behavioral disturbance: Secondary | ICD-10-CM | POA: Diagnosis not present

## 2019-03-31 DIAGNOSIS — M4126 Other idiopathic scoliosis, lumbar region: Secondary | ICD-10-CM | POA: Diagnosis not present

## 2019-04-02 DIAGNOSIS — Z79899 Other long term (current) drug therapy: Secondary | ICD-10-CM | POA: Diagnosis not present

## 2019-04-02 DIAGNOSIS — I480 Paroxysmal atrial fibrillation: Secondary | ICD-10-CM | POA: Diagnosis not present

## 2019-04-02 DIAGNOSIS — F0390 Unspecified dementia without behavioral disturbance: Secondary | ICD-10-CM | POA: Diagnosis not present

## 2019-04-02 DIAGNOSIS — K5909 Other constipation: Secondary | ICD-10-CM | POA: Diagnosis not present

## 2019-04-02 DIAGNOSIS — R159 Full incontinence of feces: Secondary | ICD-10-CM | POA: Diagnosis not present

## 2019-04-02 DIAGNOSIS — J449 Chronic obstructive pulmonary disease, unspecified: Secondary | ICD-10-CM | POA: Diagnosis not present

## 2019-04-02 DIAGNOSIS — M4126 Other idiopathic scoliosis, lumbar region: Secondary | ICD-10-CM | POA: Diagnosis not present

## 2019-04-02 DIAGNOSIS — R195 Other fecal abnormalities: Secondary | ICD-10-CM | POA: Diagnosis not present

## 2019-04-02 DIAGNOSIS — I10 Essential (primary) hypertension: Secondary | ICD-10-CM | POA: Diagnosis not present

## 2019-04-02 DIAGNOSIS — M810 Age-related osteoporosis without current pathological fracture: Secondary | ICD-10-CM | POA: Diagnosis not present

## 2019-04-05 DIAGNOSIS — I10 Essential (primary) hypertension: Secondary | ICD-10-CM | POA: Diagnosis not present

## 2019-04-05 DIAGNOSIS — J449 Chronic obstructive pulmonary disease, unspecified: Secondary | ICD-10-CM | POA: Diagnosis not present

## 2019-04-05 DIAGNOSIS — I480 Paroxysmal atrial fibrillation: Secondary | ICD-10-CM | POA: Diagnosis not present

## 2019-04-05 DIAGNOSIS — F0390 Unspecified dementia without behavioral disturbance: Secondary | ICD-10-CM | POA: Diagnosis not present

## 2019-04-05 DIAGNOSIS — M4126 Other idiopathic scoliosis, lumbar region: Secondary | ICD-10-CM | POA: Diagnosis not present

## 2019-04-05 DIAGNOSIS — M810 Age-related osteoporosis without current pathological fracture: Secondary | ICD-10-CM | POA: Diagnosis not present

## 2019-04-07 DIAGNOSIS — I480 Paroxysmal atrial fibrillation: Secondary | ICD-10-CM | POA: Diagnosis not present

## 2019-04-07 DIAGNOSIS — M4126 Other idiopathic scoliosis, lumbar region: Secondary | ICD-10-CM | POA: Diagnosis not present

## 2019-04-07 DIAGNOSIS — I10 Essential (primary) hypertension: Secondary | ICD-10-CM | POA: Diagnosis not present

## 2019-04-07 DIAGNOSIS — M810 Age-related osteoporosis without current pathological fracture: Secondary | ICD-10-CM | POA: Diagnosis not present

## 2019-04-07 DIAGNOSIS — F0390 Unspecified dementia without behavioral disturbance: Secondary | ICD-10-CM | POA: Diagnosis not present

## 2019-04-07 DIAGNOSIS — J449 Chronic obstructive pulmonary disease, unspecified: Secondary | ICD-10-CM | POA: Diagnosis not present

## 2019-04-08 DIAGNOSIS — M4126 Other idiopathic scoliosis, lumbar region: Secondary | ICD-10-CM | POA: Diagnosis not present

## 2019-04-08 DIAGNOSIS — M810 Age-related osteoporosis without current pathological fracture: Secondary | ICD-10-CM | POA: Diagnosis not present

## 2019-04-08 DIAGNOSIS — J449 Chronic obstructive pulmonary disease, unspecified: Secondary | ICD-10-CM | POA: Diagnosis not present

## 2019-04-08 DIAGNOSIS — F0390 Unspecified dementia without behavioral disturbance: Secondary | ICD-10-CM | POA: Diagnosis not present

## 2019-04-08 DIAGNOSIS — I480 Paroxysmal atrial fibrillation: Secondary | ICD-10-CM | POA: Diagnosis not present

## 2019-04-08 DIAGNOSIS — I10 Essential (primary) hypertension: Secondary | ICD-10-CM | POA: Diagnosis not present

## 2019-04-09 DIAGNOSIS — I480 Paroxysmal atrial fibrillation: Secondary | ICD-10-CM | POA: Diagnosis not present

## 2019-04-09 DIAGNOSIS — L89520 Pressure ulcer of left ankle, unstageable: Secondary | ICD-10-CM | POA: Diagnosis not present

## 2019-04-09 DIAGNOSIS — Z7982 Long term (current) use of aspirin: Secondary | ICD-10-CM | POA: Diagnosis not present

## 2019-04-09 DIAGNOSIS — R195 Other fecal abnormalities: Secondary | ICD-10-CM | POA: Diagnosis not present

## 2019-04-09 DIAGNOSIS — L89622 Pressure ulcer of left heel, stage 2: Secondary | ICD-10-CM | POA: Diagnosis not present

## 2019-04-09 DIAGNOSIS — M810 Age-related osteoporosis without current pathological fracture: Secondary | ICD-10-CM | POA: Diagnosis not present

## 2019-04-09 DIAGNOSIS — Z87891 Personal history of nicotine dependence: Secondary | ICD-10-CM | POA: Diagnosis not present

## 2019-04-09 DIAGNOSIS — L89512 Pressure ulcer of right ankle, stage 2: Secondary | ICD-10-CM | POA: Diagnosis not present

## 2019-04-09 DIAGNOSIS — J449 Chronic obstructive pulmonary disease, unspecified: Secondary | ICD-10-CM | POA: Diagnosis not present

## 2019-04-09 DIAGNOSIS — R32 Unspecified urinary incontinence: Secondary | ICD-10-CM | POA: Diagnosis not present

## 2019-04-09 DIAGNOSIS — I10 Essential (primary) hypertension: Secondary | ICD-10-CM | POA: Diagnosis not present

## 2019-04-09 DIAGNOSIS — Z7901 Long term (current) use of anticoagulants: Secondary | ICD-10-CM | POA: Diagnosis not present

## 2019-04-09 DIAGNOSIS — Z4800 Encounter for change or removal of nonsurgical wound dressing: Secondary | ICD-10-CM | POA: Diagnosis not present

## 2019-04-09 DIAGNOSIS — M4126 Other idiopathic scoliosis, lumbar region: Secondary | ICD-10-CM | POA: Diagnosis not present

## 2019-04-09 DIAGNOSIS — I714 Abdominal aortic aneurysm, without rupture: Secondary | ICD-10-CM | POA: Diagnosis not present

## 2019-04-09 DIAGNOSIS — F0390 Unspecified dementia without behavioral disturbance: Secondary | ICD-10-CM | POA: Diagnosis not present

## 2019-04-10 DIAGNOSIS — L89622 Pressure ulcer of left heel, stage 2: Secondary | ICD-10-CM | POA: Diagnosis not present

## 2019-04-10 DIAGNOSIS — L89512 Pressure ulcer of right ankle, stage 2: Secondary | ICD-10-CM | POA: Diagnosis not present

## 2019-04-10 DIAGNOSIS — L89520 Pressure ulcer of left ankle, unstageable: Secondary | ICD-10-CM | POA: Diagnosis not present

## 2019-04-13 DIAGNOSIS — L89512 Pressure ulcer of right ankle, stage 2: Secondary | ICD-10-CM | POA: Diagnosis not present

## 2019-04-13 DIAGNOSIS — I480 Paroxysmal atrial fibrillation: Secondary | ICD-10-CM | POA: Diagnosis not present

## 2019-04-13 DIAGNOSIS — J449 Chronic obstructive pulmonary disease, unspecified: Secondary | ICD-10-CM | POA: Diagnosis not present

## 2019-04-13 DIAGNOSIS — F0390 Unspecified dementia without behavioral disturbance: Secondary | ICD-10-CM | POA: Diagnosis not present

## 2019-04-13 DIAGNOSIS — L89520 Pressure ulcer of left ankle, unstageable: Secondary | ICD-10-CM | POA: Diagnosis not present

## 2019-04-13 DIAGNOSIS — L89622 Pressure ulcer of left heel, stage 2: Secondary | ICD-10-CM | POA: Diagnosis not present

## 2019-04-16 DIAGNOSIS — R410 Disorientation, unspecified: Secondary | ICD-10-CM | POA: Diagnosis not present

## 2019-04-16 DIAGNOSIS — L89622 Pressure ulcer of left heel, stage 2: Secondary | ICD-10-CM | POA: Diagnosis not present

## 2019-04-16 DIAGNOSIS — R8290 Unspecified abnormal findings in urine: Secondary | ICD-10-CM | POA: Diagnosis not present

## 2019-04-16 DIAGNOSIS — L89512 Pressure ulcer of right ankle, stage 2: Secondary | ICD-10-CM | POA: Diagnosis not present

## 2019-04-16 DIAGNOSIS — I480 Paroxysmal atrial fibrillation: Secondary | ICD-10-CM | POA: Diagnosis not present

## 2019-04-16 DIAGNOSIS — W07XXXA Fall from chair, initial encounter: Secondary | ICD-10-CM | POA: Diagnosis not present

## 2019-04-16 DIAGNOSIS — F0390 Unspecified dementia without behavioral disturbance: Secondary | ICD-10-CM | POA: Diagnosis not present

## 2019-04-16 DIAGNOSIS — Z79899 Other long term (current) drug therapy: Secondary | ICD-10-CM | POA: Diagnosis not present

## 2019-04-16 DIAGNOSIS — R001 Bradycardia, unspecified: Secondary | ICD-10-CM | POA: Diagnosis not present

## 2019-04-16 DIAGNOSIS — J449 Chronic obstructive pulmonary disease, unspecified: Secondary | ICD-10-CM | POA: Diagnosis not present

## 2019-04-16 DIAGNOSIS — L89520 Pressure ulcer of left ankle, unstageable: Secondary | ICD-10-CM | POA: Diagnosis not present

## 2019-04-20 DIAGNOSIS — L89512 Pressure ulcer of right ankle, stage 2: Secondary | ICD-10-CM | POA: Diagnosis not present

## 2019-04-20 DIAGNOSIS — I714 Abdominal aortic aneurysm, without rupture: Secondary | ICD-10-CM | POA: Diagnosis not present

## 2019-04-20 DIAGNOSIS — I4891 Unspecified atrial fibrillation: Secondary | ICD-10-CM | POA: Diagnosis not present

## 2019-04-20 DIAGNOSIS — J449 Chronic obstructive pulmonary disease, unspecified: Secondary | ICD-10-CM | POA: Diagnosis not present

## 2019-04-20 DIAGNOSIS — I480 Paroxysmal atrial fibrillation: Secondary | ICD-10-CM | POA: Diagnosis not present

## 2019-04-20 DIAGNOSIS — L89622 Pressure ulcer of left heel, stage 2: Secondary | ICD-10-CM | POA: Diagnosis not present

## 2019-04-20 DIAGNOSIS — L89520 Pressure ulcer of left ankle, unstageable: Secondary | ICD-10-CM | POA: Diagnosis not present

## 2019-04-20 DIAGNOSIS — Z79899 Other long term (current) drug therapy: Secondary | ICD-10-CM | POA: Diagnosis not present

## 2019-04-20 DIAGNOSIS — R269 Unspecified abnormalities of gait and mobility: Secondary | ICD-10-CM | POA: Diagnosis not present

## 2019-04-20 DIAGNOSIS — F0390 Unspecified dementia without behavioral disturbance: Secondary | ICD-10-CM | POA: Diagnosis not present

## 2019-04-20 DIAGNOSIS — I10 Essential (primary) hypertension: Secondary | ICD-10-CM | POA: Diagnosis not present

## 2019-04-21 DIAGNOSIS — F0390 Unspecified dementia without behavioral disturbance: Secondary | ICD-10-CM | POA: Diagnosis not present

## 2019-04-21 DIAGNOSIS — L89622 Pressure ulcer of left heel, stage 2: Secondary | ICD-10-CM | POA: Diagnosis not present

## 2019-04-21 DIAGNOSIS — I480 Paroxysmal atrial fibrillation: Secondary | ICD-10-CM | POA: Diagnosis not present

## 2019-04-21 DIAGNOSIS — L89520 Pressure ulcer of left ankle, unstageable: Secondary | ICD-10-CM | POA: Diagnosis not present

## 2019-04-21 DIAGNOSIS — L89512 Pressure ulcer of right ankle, stage 2: Secondary | ICD-10-CM | POA: Diagnosis not present

## 2019-04-21 DIAGNOSIS — J449 Chronic obstructive pulmonary disease, unspecified: Secondary | ICD-10-CM | POA: Diagnosis not present

## 2019-04-22 DIAGNOSIS — I480 Paroxysmal atrial fibrillation: Secondary | ICD-10-CM | POA: Diagnosis not present

## 2019-04-22 DIAGNOSIS — J449 Chronic obstructive pulmonary disease, unspecified: Secondary | ICD-10-CM | POA: Diagnosis not present

## 2019-04-22 DIAGNOSIS — F0390 Unspecified dementia without behavioral disturbance: Secondary | ICD-10-CM | POA: Diagnosis not present

## 2019-04-22 DIAGNOSIS — L89512 Pressure ulcer of right ankle, stage 2: Secondary | ICD-10-CM | POA: Diagnosis not present

## 2019-04-22 DIAGNOSIS — L89520 Pressure ulcer of left ankle, unstageable: Secondary | ICD-10-CM | POA: Diagnosis not present

## 2019-04-22 DIAGNOSIS — L89622 Pressure ulcer of left heel, stage 2: Secondary | ICD-10-CM | POA: Diagnosis not present

## 2019-04-23 DIAGNOSIS — L89520 Pressure ulcer of left ankle, unstageable: Secondary | ICD-10-CM | POA: Diagnosis not present

## 2019-04-23 DIAGNOSIS — L89512 Pressure ulcer of right ankle, stage 2: Secondary | ICD-10-CM | POA: Diagnosis not present

## 2019-04-23 DIAGNOSIS — I480 Paroxysmal atrial fibrillation: Secondary | ICD-10-CM | POA: Diagnosis not present

## 2019-04-23 DIAGNOSIS — J449 Chronic obstructive pulmonary disease, unspecified: Secondary | ICD-10-CM | POA: Diagnosis not present

## 2019-04-23 DIAGNOSIS — L89622 Pressure ulcer of left heel, stage 2: Secondary | ICD-10-CM | POA: Diagnosis not present

## 2019-04-23 DIAGNOSIS — F0390 Unspecified dementia without behavioral disturbance: Secondary | ICD-10-CM | POA: Diagnosis not present

## 2019-04-26 DIAGNOSIS — L89520 Pressure ulcer of left ankle, unstageable: Secondary | ICD-10-CM | POA: Diagnosis not present

## 2019-04-26 DIAGNOSIS — I480 Paroxysmal atrial fibrillation: Secondary | ICD-10-CM | POA: Diagnosis not present

## 2019-04-26 DIAGNOSIS — L89512 Pressure ulcer of right ankle, stage 2: Secondary | ICD-10-CM | POA: Diagnosis not present

## 2019-04-26 DIAGNOSIS — F0390 Unspecified dementia without behavioral disturbance: Secondary | ICD-10-CM | POA: Diagnosis not present

## 2019-04-26 DIAGNOSIS — L89622 Pressure ulcer of left heel, stage 2: Secondary | ICD-10-CM | POA: Diagnosis not present

## 2019-04-26 DIAGNOSIS — J449 Chronic obstructive pulmonary disease, unspecified: Secondary | ICD-10-CM | POA: Diagnosis not present

## 2019-04-28 DIAGNOSIS — L89512 Pressure ulcer of right ankle, stage 2: Secondary | ICD-10-CM | POA: Diagnosis not present

## 2019-04-28 DIAGNOSIS — L89520 Pressure ulcer of left ankle, unstageable: Secondary | ICD-10-CM | POA: Diagnosis not present

## 2019-04-28 DIAGNOSIS — L89622 Pressure ulcer of left heel, stage 2: Secondary | ICD-10-CM | POA: Diagnosis not present

## 2019-04-28 DIAGNOSIS — F0390 Unspecified dementia without behavioral disturbance: Secondary | ICD-10-CM | POA: Diagnosis not present

## 2019-04-28 DIAGNOSIS — J449 Chronic obstructive pulmonary disease, unspecified: Secondary | ICD-10-CM | POA: Diagnosis not present

## 2019-04-28 DIAGNOSIS — I480 Paroxysmal atrial fibrillation: Secondary | ICD-10-CM | POA: Diagnosis not present

## 2019-04-29 DIAGNOSIS — L89520 Pressure ulcer of left ankle, unstageable: Secondary | ICD-10-CM | POA: Diagnosis not present

## 2019-04-29 DIAGNOSIS — L89622 Pressure ulcer of left heel, stage 2: Secondary | ICD-10-CM | POA: Diagnosis not present

## 2019-04-29 DIAGNOSIS — I480 Paroxysmal atrial fibrillation: Secondary | ICD-10-CM | POA: Diagnosis not present

## 2019-04-29 DIAGNOSIS — F0390 Unspecified dementia without behavioral disturbance: Secondary | ICD-10-CM | POA: Diagnosis not present

## 2019-04-29 DIAGNOSIS — L89512 Pressure ulcer of right ankle, stage 2: Secondary | ICD-10-CM | POA: Diagnosis not present

## 2019-04-29 DIAGNOSIS — J449 Chronic obstructive pulmonary disease, unspecified: Secondary | ICD-10-CM | POA: Diagnosis not present

## 2019-04-30 DIAGNOSIS — L89622 Pressure ulcer of left heel, stage 2: Secondary | ICD-10-CM | POA: Diagnosis not present

## 2019-04-30 DIAGNOSIS — J449 Chronic obstructive pulmonary disease, unspecified: Secondary | ICD-10-CM | POA: Diagnosis not present

## 2019-04-30 DIAGNOSIS — L89520 Pressure ulcer of left ankle, unstageable: Secondary | ICD-10-CM | POA: Diagnosis not present

## 2019-04-30 DIAGNOSIS — L89512 Pressure ulcer of right ankle, stage 2: Secondary | ICD-10-CM | POA: Diagnosis not present

## 2019-04-30 DIAGNOSIS — I480 Paroxysmal atrial fibrillation: Secondary | ICD-10-CM | POA: Diagnosis not present

## 2019-04-30 DIAGNOSIS — F0390 Unspecified dementia without behavioral disturbance: Secondary | ICD-10-CM | POA: Diagnosis not present

## 2019-05-05 DIAGNOSIS — I480 Paroxysmal atrial fibrillation: Secondary | ICD-10-CM | POA: Diagnosis not present

## 2019-05-05 DIAGNOSIS — F0390 Unspecified dementia without behavioral disturbance: Secondary | ICD-10-CM | POA: Diagnosis not present

## 2019-05-05 DIAGNOSIS — L89622 Pressure ulcer of left heel, stage 2: Secondary | ICD-10-CM | POA: Diagnosis not present

## 2019-05-05 DIAGNOSIS — L89512 Pressure ulcer of right ankle, stage 2: Secondary | ICD-10-CM | POA: Diagnosis not present

## 2019-05-05 DIAGNOSIS — J449 Chronic obstructive pulmonary disease, unspecified: Secondary | ICD-10-CM | POA: Diagnosis not present

## 2019-05-05 DIAGNOSIS — L89520 Pressure ulcer of left ankle, unstageable: Secondary | ICD-10-CM | POA: Diagnosis not present

## 2019-05-07 DIAGNOSIS — L89512 Pressure ulcer of right ankle, stage 2: Secondary | ICD-10-CM | POA: Diagnosis not present

## 2019-05-07 DIAGNOSIS — I480 Paroxysmal atrial fibrillation: Secondary | ICD-10-CM | POA: Diagnosis not present

## 2019-05-07 DIAGNOSIS — F0390 Unspecified dementia without behavioral disturbance: Secondary | ICD-10-CM | POA: Diagnosis not present

## 2019-05-07 DIAGNOSIS — J449 Chronic obstructive pulmonary disease, unspecified: Secondary | ICD-10-CM | POA: Diagnosis not present

## 2019-05-07 DIAGNOSIS — L89622 Pressure ulcer of left heel, stage 2: Secondary | ICD-10-CM | POA: Diagnosis not present

## 2019-05-07 DIAGNOSIS — L89520 Pressure ulcer of left ankle, unstageable: Secondary | ICD-10-CM | POA: Diagnosis not present

## 2019-05-09 DIAGNOSIS — F0390 Unspecified dementia without behavioral disturbance: Secondary | ICD-10-CM | POA: Diagnosis not present

## 2019-05-09 DIAGNOSIS — L89520 Pressure ulcer of left ankle, unstageable: Secondary | ICD-10-CM | POA: Diagnosis not present

## 2019-05-09 DIAGNOSIS — Z4800 Encounter for change or removal of nonsurgical wound dressing: Secondary | ICD-10-CM | POA: Diagnosis not present

## 2019-05-09 DIAGNOSIS — L89512 Pressure ulcer of right ankle, stage 2: Secondary | ICD-10-CM | POA: Diagnosis not present

## 2019-05-09 DIAGNOSIS — I480 Paroxysmal atrial fibrillation: Secondary | ICD-10-CM | POA: Diagnosis not present

## 2019-05-09 DIAGNOSIS — I714 Abdominal aortic aneurysm, without rupture: Secondary | ICD-10-CM | POA: Diagnosis not present

## 2019-05-09 DIAGNOSIS — Z7901 Long term (current) use of anticoagulants: Secondary | ICD-10-CM | POA: Diagnosis not present

## 2019-05-09 DIAGNOSIS — I10 Essential (primary) hypertension: Secondary | ICD-10-CM | POA: Diagnosis not present

## 2019-05-09 DIAGNOSIS — M4126 Other idiopathic scoliosis, lumbar region: Secondary | ICD-10-CM | POA: Diagnosis not present

## 2019-05-09 DIAGNOSIS — Z7982 Long term (current) use of aspirin: Secondary | ICD-10-CM | POA: Diagnosis not present

## 2019-05-09 DIAGNOSIS — M810 Age-related osteoporosis without current pathological fracture: Secondary | ICD-10-CM | POA: Diagnosis not present

## 2019-05-09 DIAGNOSIS — L89622 Pressure ulcer of left heel, stage 2: Secondary | ICD-10-CM | POA: Diagnosis not present

## 2019-05-09 DIAGNOSIS — J449 Chronic obstructive pulmonary disease, unspecified: Secondary | ICD-10-CM | POA: Diagnosis not present

## 2019-05-09 DIAGNOSIS — Z87891 Personal history of nicotine dependence: Secondary | ICD-10-CM | POA: Diagnosis not present

## 2019-05-11 DIAGNOSIS — J449 Chronic obstructive pulmonary disease, unspecified: Secondary | ICD-10-CM | POA: Diagnosis not present

## 2019-05-11 DIAGNOSIS — F0390 Unspecified dementia without behavioral disturbance: Secondary | ICD-10-CM | POA: Diagnosis not present

## 2019-05-11 DIAGNOSIS — J188 Other pneumonia, unspecified organism: Secondary | ICD-10-CM | POA: Diagnosis not present

## 2019-05-11 DIAGNOSIS — R269 Unspecified abnormalities of gait and mobility: Secondary | ICD-10-CM | POA: Diagnosis not present

## 2019-05-11 DIAGNOSIS — I480 Paroxysmal atrial fibrillation: Secondary | ICD-10-CM | POA: Diagnosis not present

## 2019-05-11 DIAGNOSIS — L89512 Pressure ulcer of right ankle, stage 2: Secondary | ICD-10-CM | POA: Diagnosis not present

## 2019-05-11 DIAGNOSIS — R0902 Hypoxemia: Secondary | ICD-10-CM | POA: Diagnosis not present

## 2019-05-11 DIAGNOSIS — Z79899 Other long term (current) drug therapy: Secondary | ICD-10-CM | POA: Diagnosis not present

## 2019-05-11 DIAGNOSIS — I4891 Unspecified atrial fibrillation: Secondary | ICD-10-CM | POA: Diagnosis not present

## 2019-05-11 DIAGNOSIS — L89520 Pressure ulcer of left ankle, unstageable: Secondary | ICD-10-CM | POA: Diagnosis not present

## 2019-05-11 DIAGNOSIS — L89622 Pressure ulcer of left heel, stage 2: Secondary | ICD-10-CM | POA: Diagnosis not present

## 2019-05-13 DIAGNOSIS — Z20828 Contact with and (suspected) exposure to other viral communicable diseases: Secondary | ICD-10-CM | POA: Diagnosis not present

## 2019-05-14 DIAGNOSIS — R05 Cough: Secondary | ICD-10-CM | POA: Diagnosis not present

## 2019-05-14 DIAGNOSIS — J188 Other pneumonia, unspecified organism: Secondary | ICD-10-CM | POA: Diagnosis not present

## 2019-05-14 DIAGNOSIS — Z9981 Dependence on supplemental oxygen: Secondary | ICD-10-CM | POA: Diagnosis not present

## 2019-05-14 DIAGNOSIS — Z79899 Other long term (current) drug therapy: Secondary | ICD-10-CM | POA: Diagnosis not present

## 2019-05-17 ENCOUNTER — Emergency Department (HOSPITAL_COMMUNITY)
Admission: EM | Admit: 2019-05-17 | Discharge: 2019-05-19 | Disposition: E | Payer: Medicare Other | Attending: Emergency Medicine | Admitting: Emergency Medicine

## 2019-05-17 DIAGNOSIS — Z87891 Personal history of nicotine dependence: Secondary | ICD-10-CM | POA: Diagnosis not present

## 2019-05-17 DIAGNOSIS — R404 Transient alteration of awareness: Secondary | ICD-10-CM | POA: Diagnosis not present

## 2019-05-17 DIAGNOSIS — I469 Cardiac arrest, cause unspecified: Secondary | ICD-10-CM | POA: Insufficient documentation

## 2019-05-17 DIAGNOSIS — I499 Cardiac arrhythmia, unspecified: Secondary | ICD-10-CM | POA: Diagnosis not present

## 2019-05-17 DIAGNOSIS — F0390 Unspecified dementia without behavioral disturbance: Secondary | ICD-10-CM | POA: Insufficient documentation

## 2019-05-17 DIAGNOSIS — R0689 Other abnormalities of breathing: Secondary | ICD-10-CM | POA: Diagnosis not present

## 2019-05-17 DIAGNOSIS — Z7901 Long term (current) use of anticoagulants: Secondary | ICD-10-CM | POA: Insufficient documentation

## 2019-05-17 DIAGNOSIS — Z79899 Other long term (current) drug therapy: Secondary | ICD-10-CM | POA: Diagnosis not present

## 2019-05-17 DIAGNOSIS — R58 Hemorrhage, not elsewhere classified: Secondary | ICD-10-CM | POA: Diagnosis not present

## 2019-05-17 DIAGNOSIS — Z7982 Long term (current) use of aspirin: Secondary | ICD-10-CM | POA: Insufficient documentation

## 2019-05-17 DIAGNOSIS — I10 Essential (primary) hypertension: Secondary | ICD-10-CM | POA: Insufficient documentation

## 2019-05-17 DIAGNOSIS — J449 Chronic obstructive pulmonary disease, unspecified: Secondary | ICD-10-CM | POA: Insufficient documentation

## 2019-05-17 DIAGNOSIS — R0681 Apnea, not elsewhere classified: Secondary | ICD-10-CM | POA: Diagnosis not present

## 2019-05-19 NOTE — ED Notes (Addendum)
[place] notified patient time of death 74. Referral [phone].

## 2019-05-19 NOTE — ED Provider Notes (Signed)
Amanda Hinton   CSN: [id number] Arrival date & time: 06-03-19  0915    History   Chief Complaint Chief Complaint  Patient presents with  . Cardiac Arrest    Amanda Hinton is a 85 y.o. female.     Amanda Patient brought by EMS.  She is coming from [facility] home.  They were called for blood in the stool.  On arrival the patient was lethargic with agonal respirations and no palpable pulse.  He began respiratory support and patient became unresponsive with PEA.  He was given 4 rounds of epinephrine, chest compressions and King airway placed prior to arrival.  No point did they have return of pulses. Past Medical History:  Diagnosis Date  . AAA (abdominal aortic aneurysm) ([place]) 2014  . COPD (chronic obstructive pulmonary disease) ([place])   . Hyperlipemia   . Hypertension   . Osteoporosis     Patient Active Problem List   Diagnosis Date Noted  . Pseudomonas sepsis ([place]) 01/20/2019  . Klebsiella sepsis ([place]) 01/20/2019  . Acute encephalopathy 01/20/2019  . Dementia without behavioral disturbance ([place]) 01/20/2019  . Atrial fibrillation ([place]) 01/20/2019  . Dry eye syndrome 01/20/2019  . SIRS (systemic inflammatory response syndrome) ([place]) 01/11/2019  . CAP (community acquired pneumonia) 01/11/2019  . Acute respiratory failure with hypoxia ([place]) 01/11/2019  . PNEUMONIA ORGANISM NOS 01/24/2011  . Hyperlipidemia, unspecified 01/23/2011  . Essential hypertension 01/23/2011  . COPD  GOLD II 01/23/2011  . OSTEOPOROSIS 01/23/2011    Past Surgical History:  Procedure Laterality Date  . APPENDECTOMY  1968  . biopsy of uterus  1968 and 1985  . COLONOSCOPY  01/2010     OB History   No obstetric history on file.      Home Medications    Prior to Admission medications   Medication Sig Start Date End Date Taking? Authorizing Provider  acetaminophen (TYLENOL) 325 MG tablet Take 2 tablets (650 mg total) by  mouth every 6 (six) hours as needed for mild pain (or Fever >/= 101). 01/16/19   Danford, Suann Larry, MD  acetaminophen (TYLENOL) 500 MG tablet Take 2 tablets (1,000 mg total) by mouth at bedtime. 01/16/19   Danford, Suann Larry, MD  apixaban (ELIQUIS) 2.5 MG TABS tablet Take 1 tablet (2.5 mg total) by mouth 2 (two) times daily. 02/05/19   Hennie Duos, MD  aspirin 81 MG tablet Take 81 mg by mouth daily.      [provider]  b complex vitamins tablet Take 1 tablet by mouth daily.    [provider]  cycloSPORINE (RESTASIS) 0.05 % ophthalmic emulsion Place 1 drop into both eyes 2 (two) times daily. 02/05/19   Hennie Duos, MD  diltiazem (CARDIZEM CD) 120 MG 24 hr capsule Take 1 capsule (120 mg total) by mouth every evening for 30 days. 02/05/19 03/07/19  Hennie Duos, MD  docusate sodium (COLACE) 100 MG capsule Take 1 capsule (100 mg total) by mouth daily as needed for mild constipation. 02/05/19   Hennie Duos, MD  donepezil (ARICEPT) 5 MG tablet Take 1 tablet (5 mg total) by mouth daily. 02/05/19   Hennie Duos, MD  metoprolol tartrate (LOPRESSOR) 100 MG tablet Take 1 tablet (100 mg total) by mouth 2 (two) times daily. 02/05/19   Hennie Duos, MD  Multiple Vitamins-Minerals (MULTIVITAMIN WITH MINERALS) tablet Take 1 tablet by mouth daily.      [provider]  polyethylene glycol (MIRALAX / GLYCOLAX) packet Take 17 g by mouth daily as needed for mild constipation. 02/05/19   Hennie Duos, MD  potassium chloride (K-DUR,KLOR-CON) 10 MEQ tablet Take 1 tablet (10 mEq total) by mouth daily. 02/05/19   Hennie Duos, MD  Potassium Chloride (KLOR-CON PO) Take 10 mEq by mouth daily. 10 meq.    [provider]  valACYclovir (VALTREX) 500 MG tablet Take 1 tablet (500 mg total) by mouth daily. 02/05/19   Hennie Duos, MD    Family History Family History  Problem Relation Age of Onset  . Heart disease Mother     Social History  Social History   Tobacco Use  . Smoking status: Former Smoker    Packs/day: 1.00    Years: 54.00    Pack years: 54.00    Types: Cigarettes    Quit date: 12/19/2010    Years since quitting: 8.4  . Smokeless tobacco: Never Used  . Tobacco comment: quit 2012  Substance Use Topics  . Alcohol use: No    Frequency: Never  . Drug use: No     Allergies   Avelox [moxifloxacin hcl in nacl] and Prednisone   Review of Systems Review of Systems 5 caveat cannot obtain due to patient deceased  Physical Exam Updated Vital Signs Pulse (!) 0   Resp (!) 0   Ht 5\' 4"  (1.626 m)   Wt 59 kg   SpO2 (!) 0%   BMI 22.31 kg/m   Physical Exam Constitutional:      Comments: Patient arrives with a King airway in place with bagging.  Lucas device delivering chest compressions.  Patient is completely pale and unresponsive  Eyes:     Comments: Pupils fixed  Cardiovascular:     Comments: No palpable pulses in the femoral or carotid region Pulmonary:     Comments: Breath sounds present with bagging.  No spontaneous respiratory effort Skin:    Coloration: Skin is pale.  Neurological:     Comments: No pain response      ED Treatments / Results  Labs (all labs ordered are listed, but only abnormal results are displayed) Labs Reviewed - No data to display  EKG None  Radiology No results found.  Procedures Procedures (including critical care time)  Medications Ordered in ED Medications - No data to display   Initial Impression / Assessment and Plan / ED Course  I have reviewed the triage vital signs and the nursing notes.  Pertinent labs & imaging results that were available during my care of the patient were reviewed by me and considered in my medical decision making (see chart for details).       Patient arrives after attempted resuscitation with 4 rounds of epinephrine and chest compressions.  airway had been established in the field.Patient did not have pulses on EMS  arrival. Pronounced dead after arrival to ED with PEA.  Final Clinical Impressions(s) / ED Diagnoses   Final diagnoses:  Cardiopulmonary arrest [facility])    ED Discharge Orders    None       Charlesetta Shanks, MD 05/18/19 [phone]

## 2019-05-19 NOTE — Progress Notes (Signed)
Responded to page for spiritual support for daughter who received news of death of pt (mother). I along with MD escorted daughter to Trauma A. Daughter requested privacy and asked me to remain in room to offer spiritual care. I offered prayer, ministry of presence, words of comfort and empathic listening. Daughter stated that she will be getting information concerning patient placement later in the day. She gave me her contact information to give to her Nurse. I gave daughter patient placement card and suggested when she was ready to contact service when she makes a decision. She was very thankful for the Chaplain presence.   Daughter stated she is power of attorney. Chalmers Cater [phone] (cell) Address: [place] [address]   Chaplain Fidel Levy [phone]

## 2019-05-19 NOTE — ED Triage Notes (Addendum)
Pt arrives via EMS from [facility], they were called out for blood in stool. On their arrival, pt lethargic, agonal RR, unable to palpate pulses, began bagging patient when pt became unresponsive, PEA rhythm, 4 epi prior to transport. On arrival to ED, pt on lucas getting CPR. CPR stopped. Pulse check performed. Pt with continued PEA, time of death 79 per Dr. Johnney Killian. King airway in place. IO left tibia.

## 2019-05-19 NOTE — ED Notes (Signed)
Pts daughter at bedside with patient. Chaplain at bedside for spiritual support.

## 2019-05-19 DEATH — deceased

## 2020-06-24 IMAGING — CR DG ELBOW COMPLETE 3+V*L*
4 series · 4 of 4 positions shown · non-contrast
Comparison: None.

CLINICAL DATA: Fall 2 days ago with pain.

EXAM:
LEFT ELBOW - COMPLETE 3+ VIEW

[x elbow ap left]
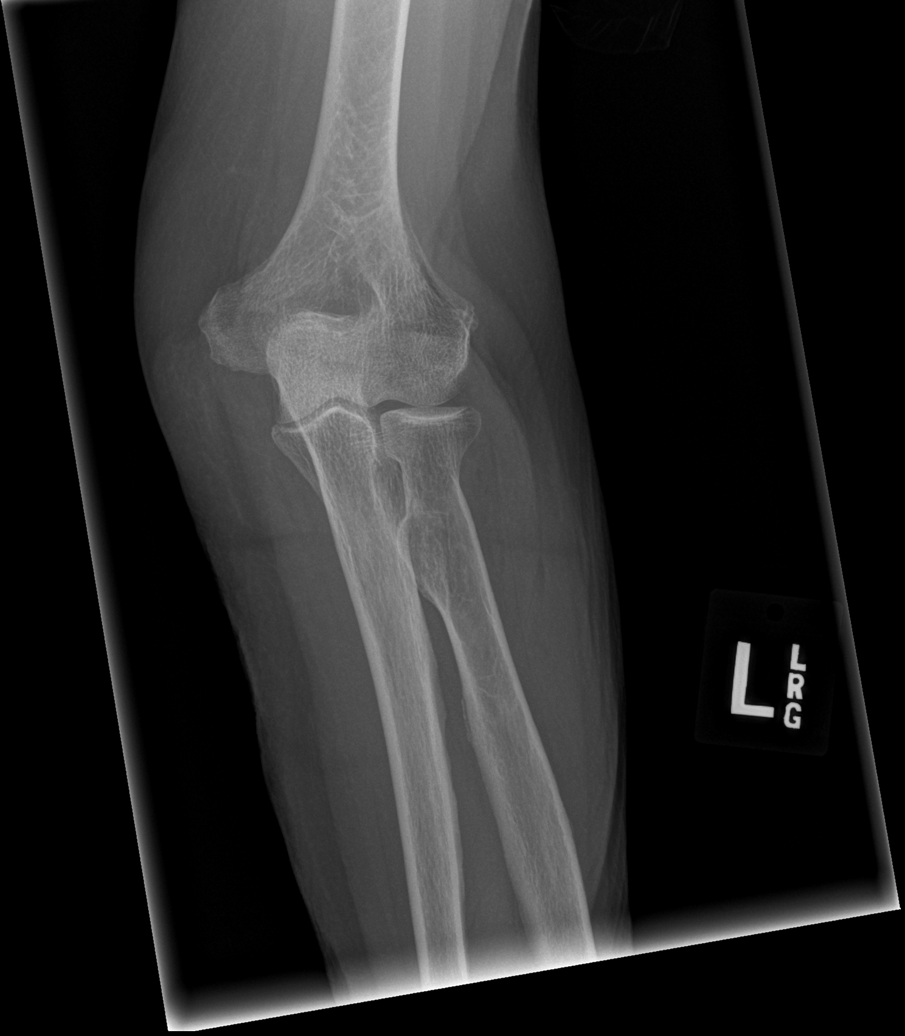

[x elbow obl left (1 of 2)]
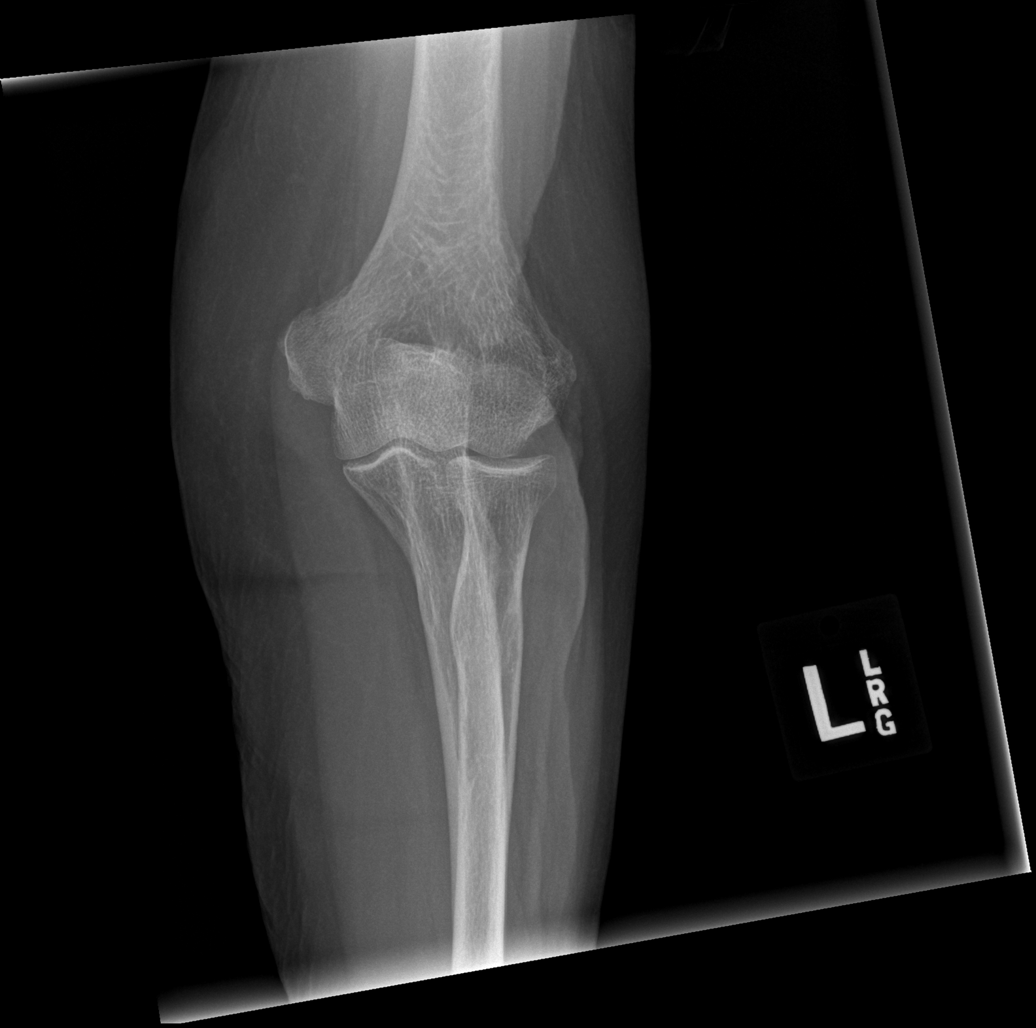

[x elbow obl left (2 of 2)]
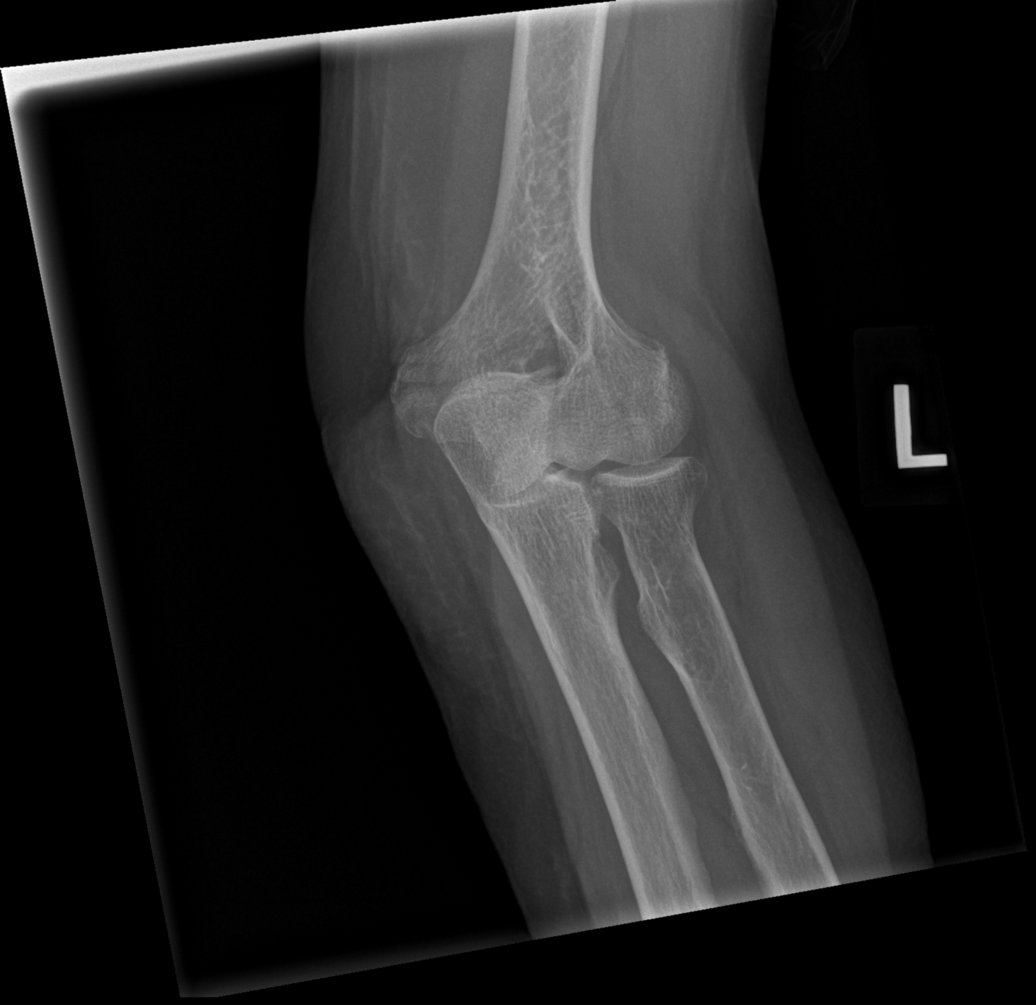

[x elbow lat left]
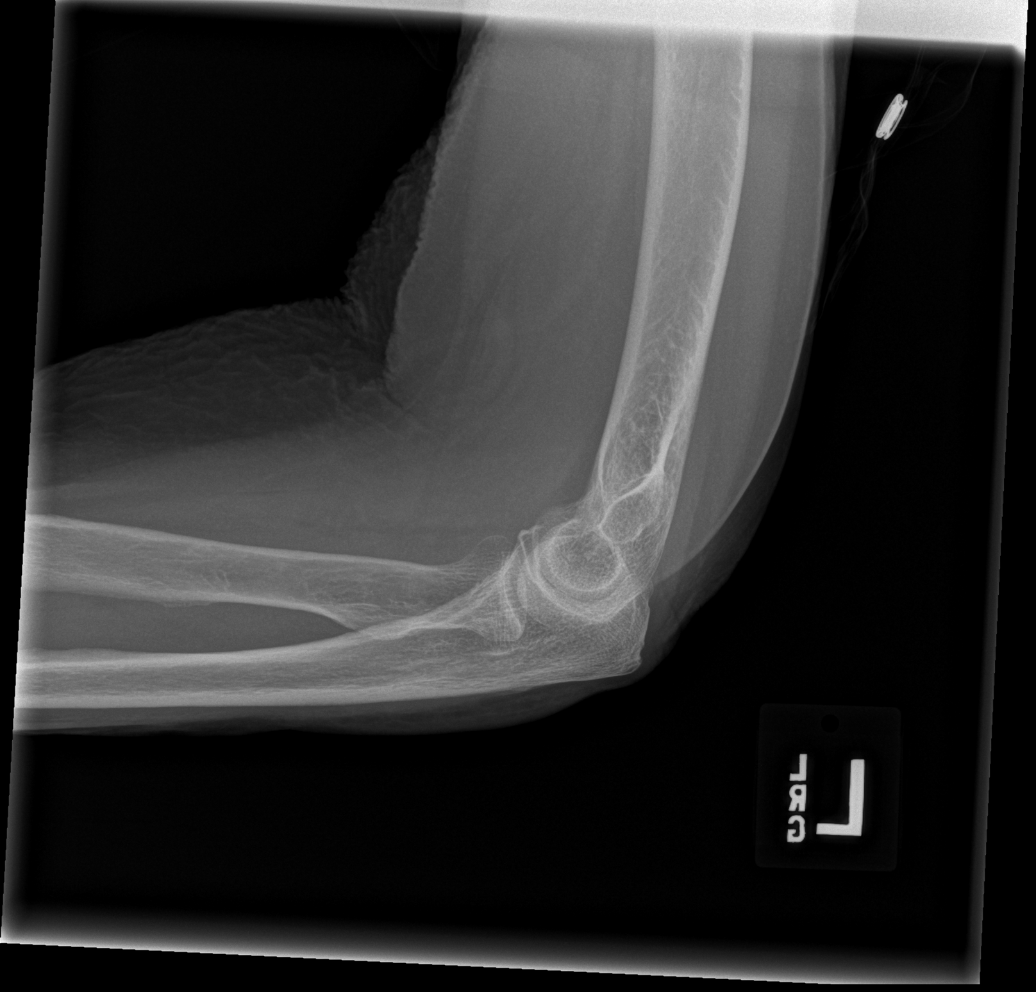

[4 of 4 positions shown; findings below may reference images not displayed]

FINDINGS: There is no evidence of fracture, dislocation, or joint effusion.
There is no evidence of arthropathy or other focal bone abnormality.
Soft tissues are unremarkable.
IMPRESSION: Negative.

## 2020-06-24 IMAGING — CR DG HIP (WITH OR WITHOUT PELVIS) 3-4V BILAT
5 series · 5 of 5 positions shown · non-contrast
Comparison: None.

CLINICAL DATA: Status post fall with hip pain.

EXAM:
DG HIP (WITH OR WITHOUT PELVIS) 3-4V BILAT

[x pelvis]
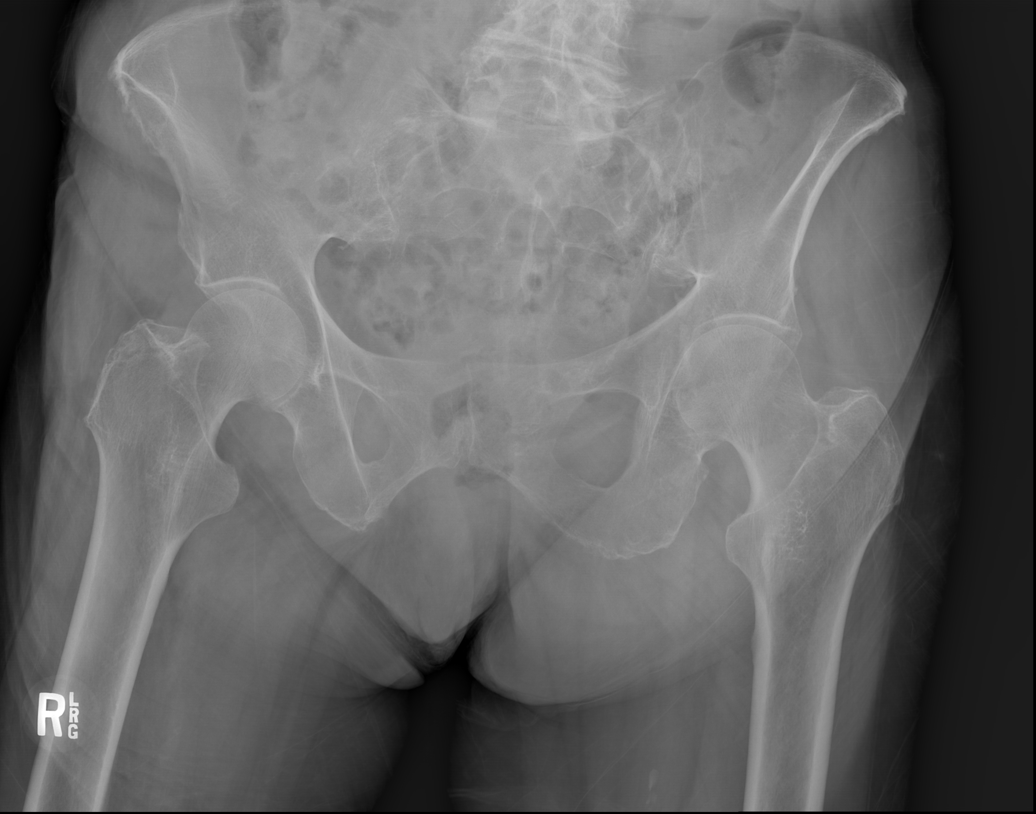

[x hip ap right]
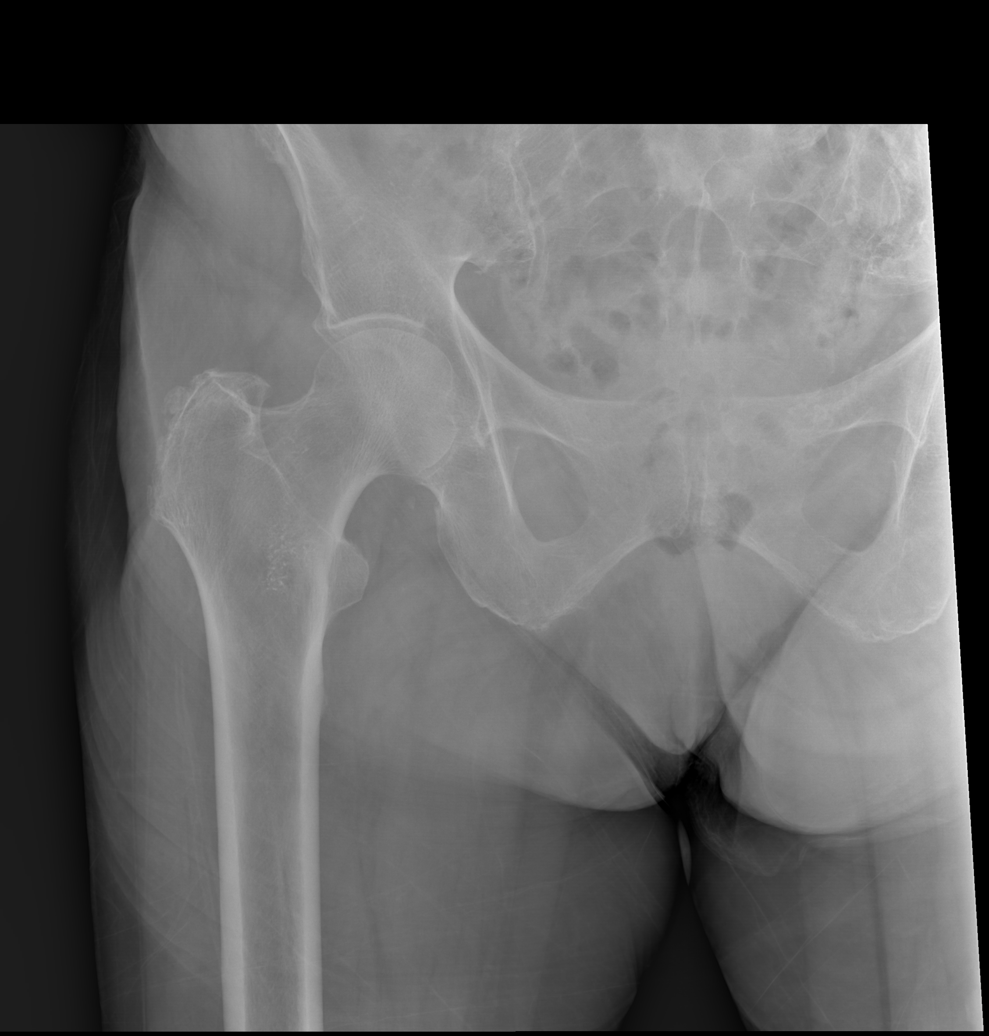

[x hip ap left]
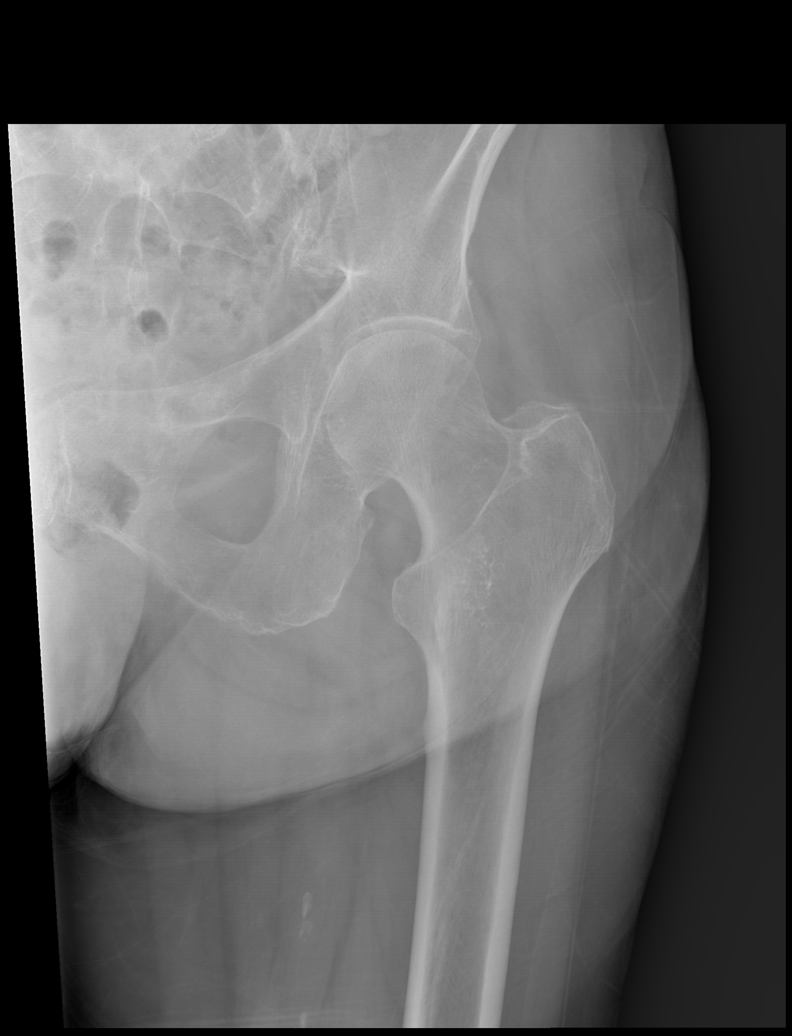

[x hip lat left]
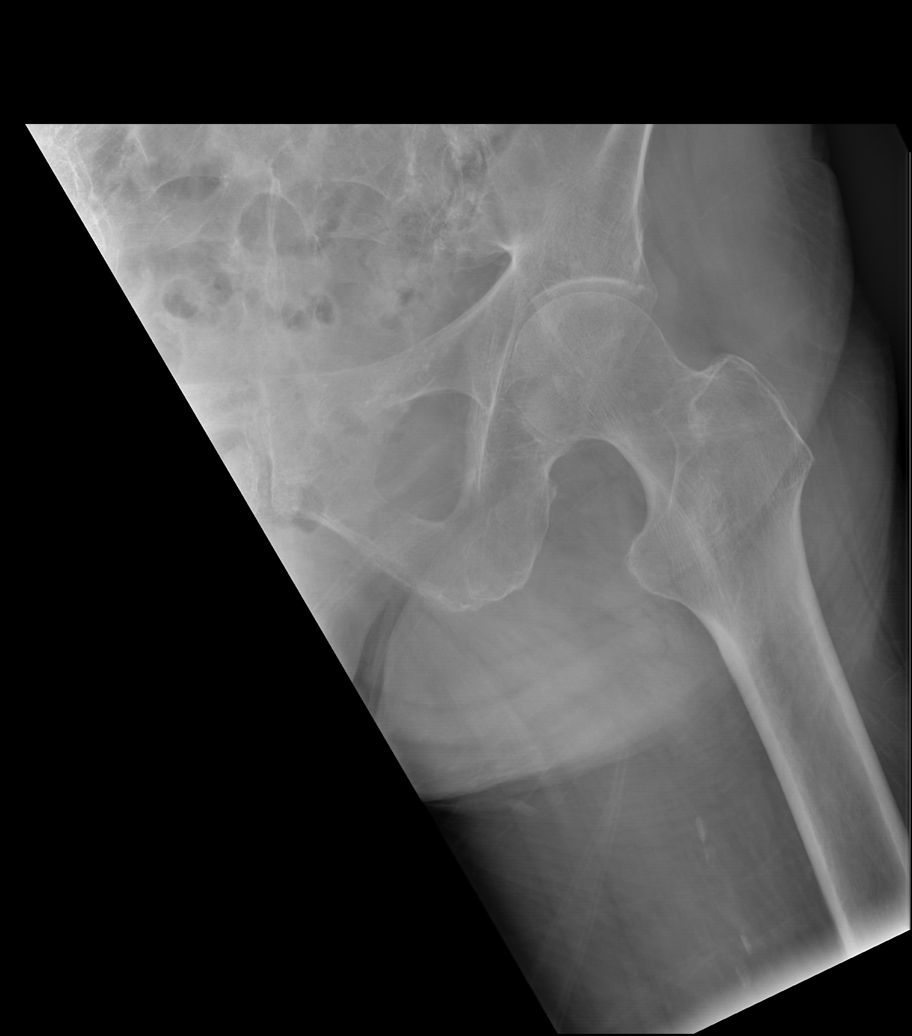

[x hip lat right]
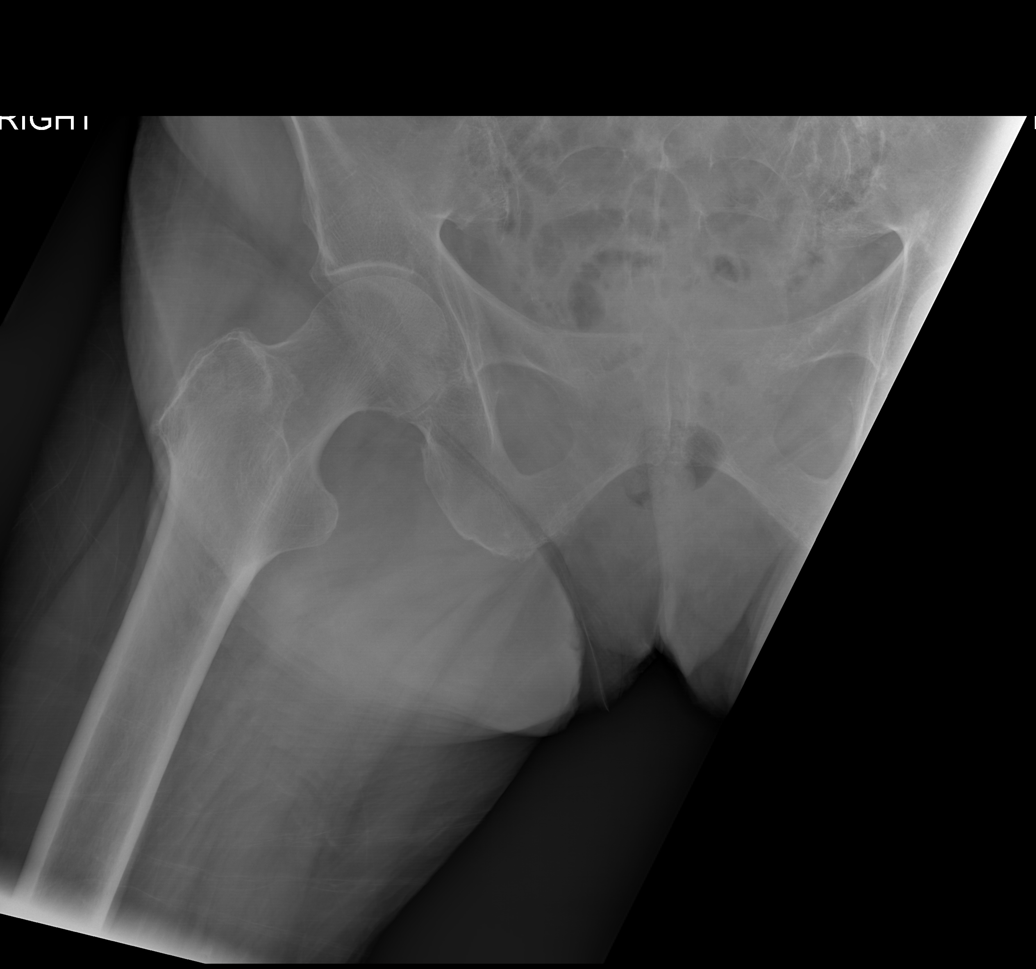

[5 of 5 positions shown; findings below may reference images not displayed]

FINDINGS: There is no evidence of hip fracture or dislocation. There is no
evidence of arthropathy or other focal bone abnormality.
IMPRESSION: Negative.

## 2020-06-24 IMAGING — CT CT HEAD W/O CM
3 series · 15 of 47 positions shown, 18 images · non-contrast
Comparison: None.

CLINICAL DATA: Patient status post fall on [REDACTED].

EXAM:
CT HEAD WITHOUT CONTRAST
TECHNIQUE: Contiguous axial images were obtained from the base of the skull
through the vertex without intravenous contrast.

[Series 2: head wo · axial · 0.47mm/px · z∈[+1513,+1643]mm · 9 of 32 slices shown, 12 images]
[im 3/32  brain]
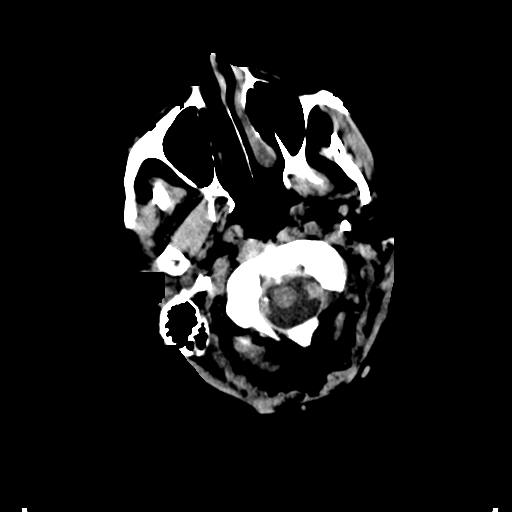
[im 3/32  bone]
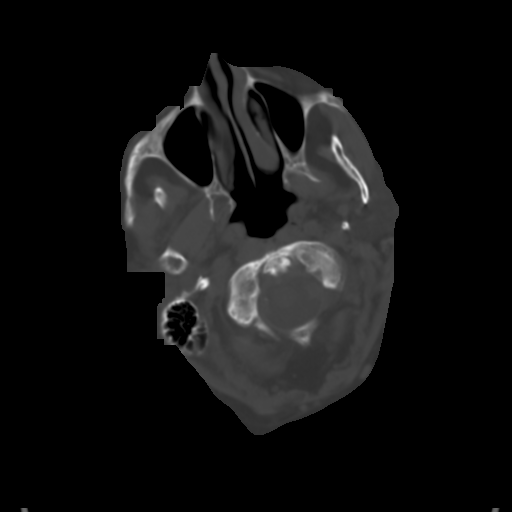
[im 6/32  brain]
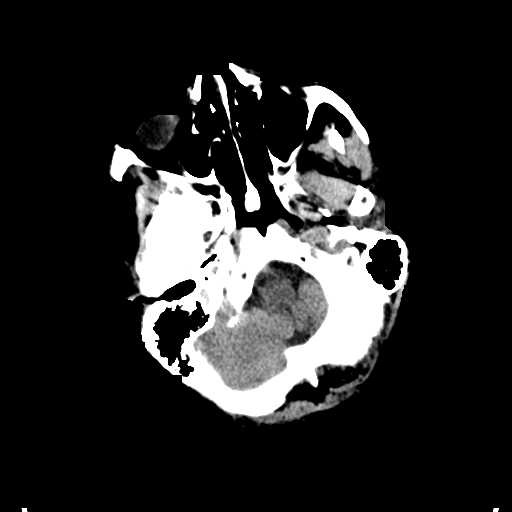
[im 9/32  brain]
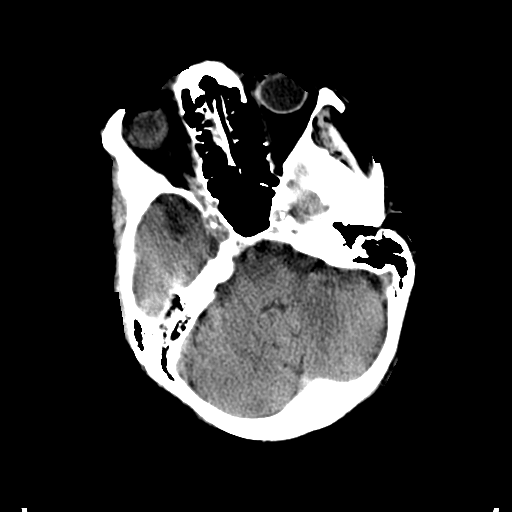
[im 12/32  brain]
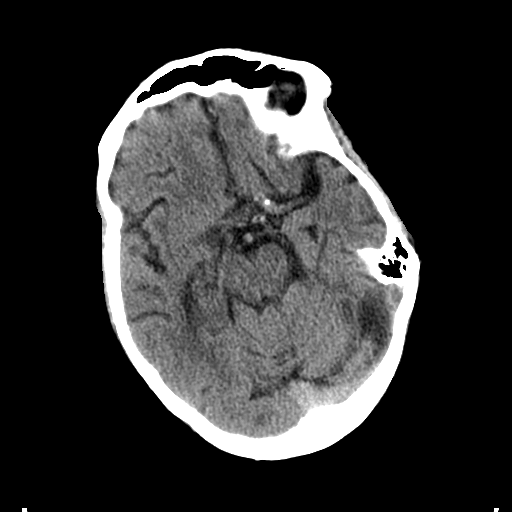
[im 17/32  brain]
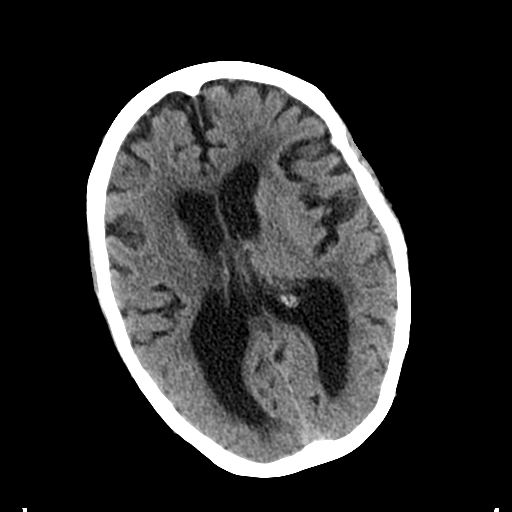
[im 17/32  bone]
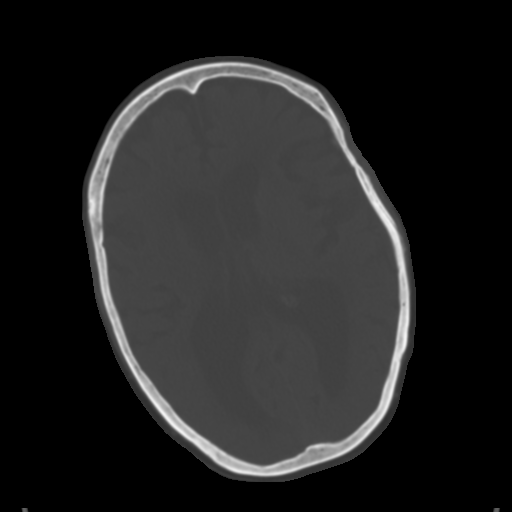
[im 20/32  brain]
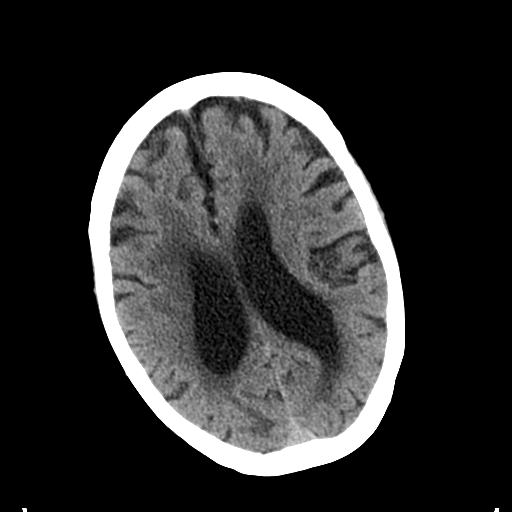
[im 23/32  brain]
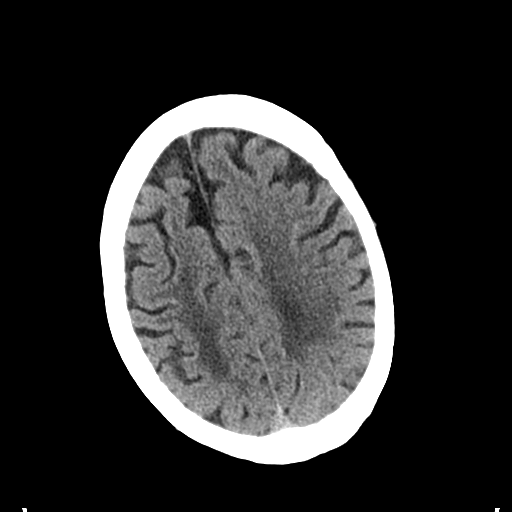
[im 26/32  brain]
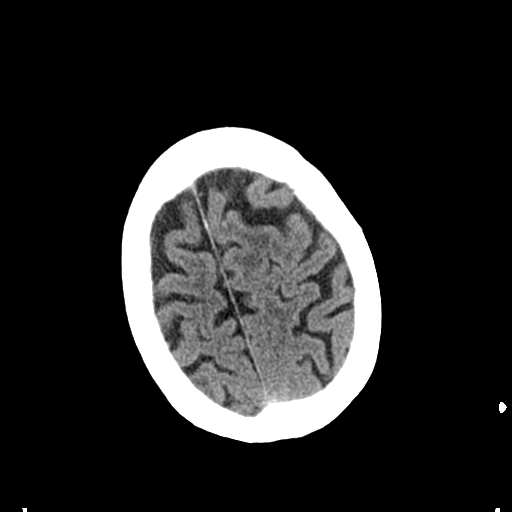
[im 29/32  brain]
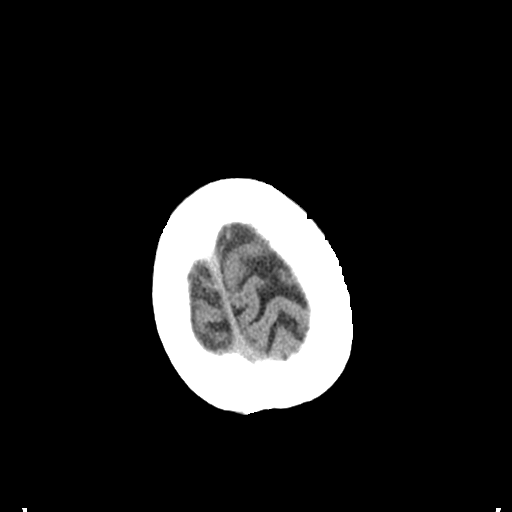
[im 29/32  bone]
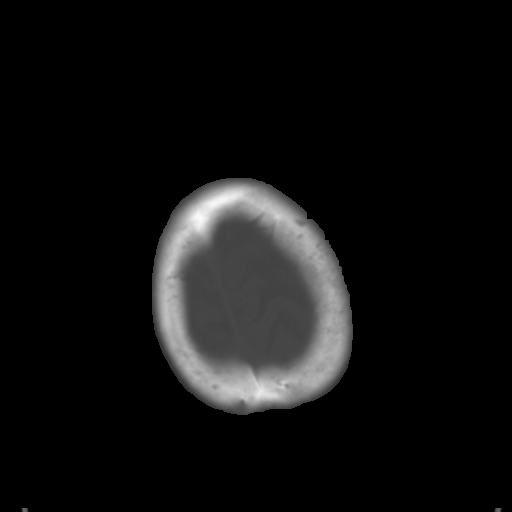

[Series 4: coronal soft tissue · coronal · 0.31mm/px · 3 of 84 slices shown]
[im 28/84  brain]
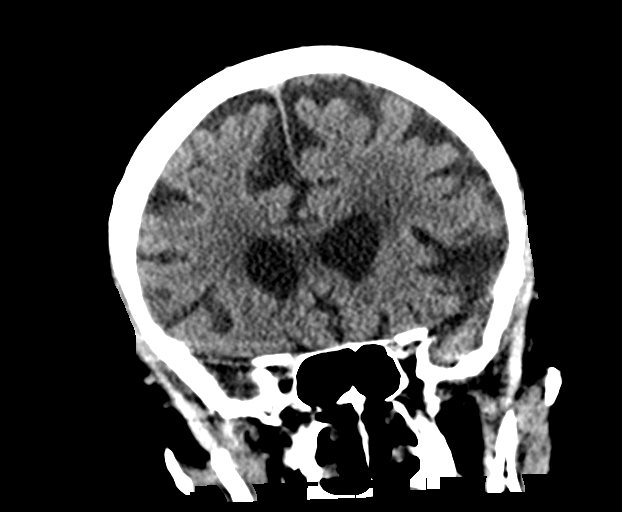
[im 37/84  brain]
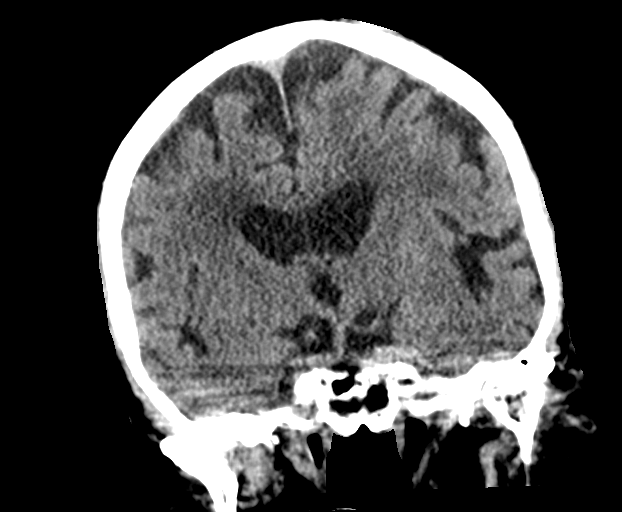
[im 47/84  brain]
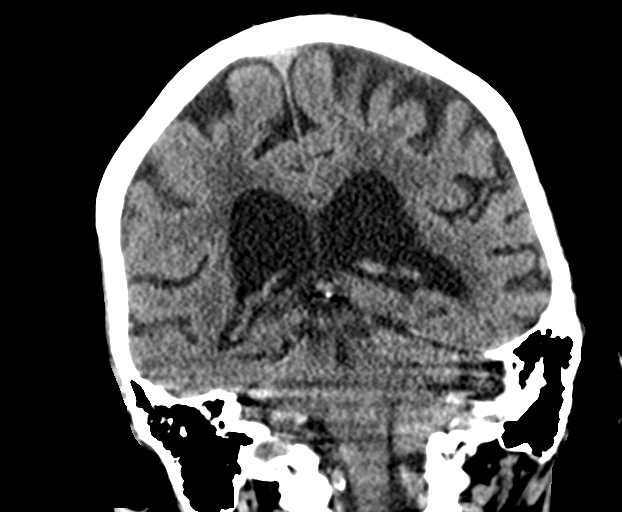

[Series 5: sagittal soft tissue · sagittal · 0.31mm/px · 3 of 69 slices shown]
[im 26/69  brain]
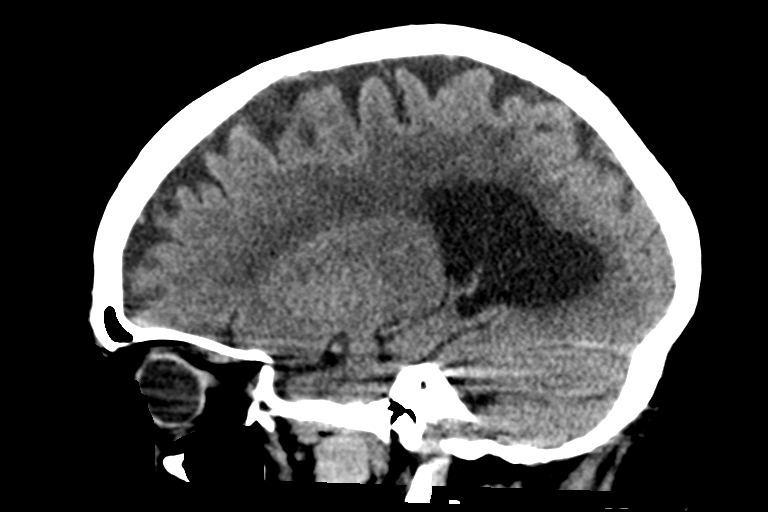
[im 34/69  brain]
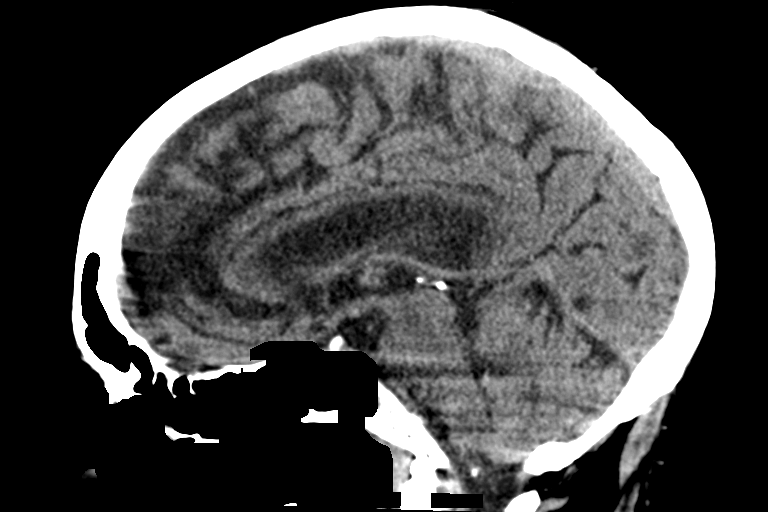
[im 42/69  brain]
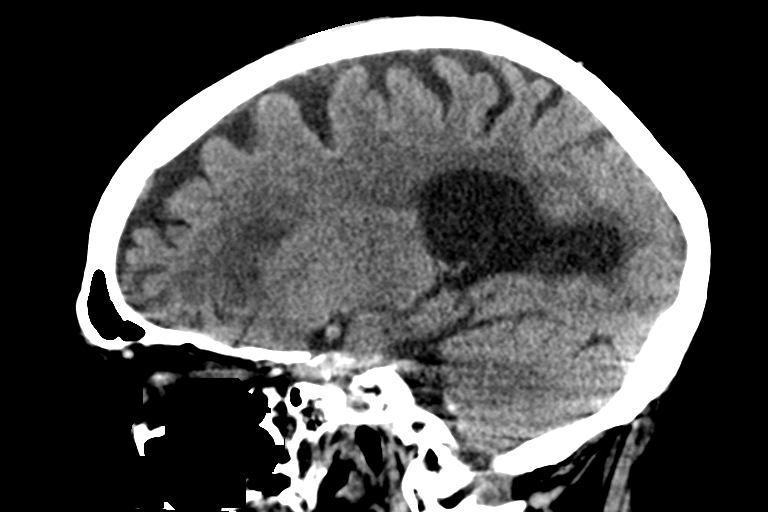

[15 of 47 positions shown; findings below may reference images not displayed]

FINDINGS: Brain: Ventricles and sulci are mildly prominent compatible with
atrophy. Periventricular and subcortical white matter hypodensity
compatible with chronic microvascular ischemic changes. No evidence
for acute cortically based infarct, intracranial hemorrhage, mass
lesion or mass-effect.

Vascular: Internal carotid arterial vascular calcifications.

Skull: Intact

Sinuses/Orbits: Paranasal sinuses are well aerated. Mastoid air
cells unremarkable.

Other: None.
IMPRESSION: No acute intracranial process.

Atrophy and chronic microvascular ischemic changes.
